# Patient Record
Sex: Female | Born: 1980
Health system: Southern US, Community
[De-identification: ages and names within clinical notes are randomized; demographics above are authoritative.]

## PROBLEM LIST (undated history)

## (undated) DIAGNOSIS — Z789 Other specified health status: Secondary | ICD-10-CM

## (undated) HISTORY — PX: NO PAST SURGERIES: SHX2092

---

## 2004-06-09 ENCOUNTER — Observation Stay: Payer: Self-pay | Admitting: Obstetrics and Gynecology

## 2004-06-24 ENCOUNTER — Inpatient Hospital Stay: Payer: Self-pay

## 2014-05-22 ENCOUNTER — Ambulatory Visit: Payer: Self-pay

## 2014-05-22 LAB — URINALYSIS, COMPLETE
Bacteria: NEGATIVE
Bilirubin,UR: NEGATIVE
Glucose,UR: NEGATIVE
Ketone: NEGATIVE
Leukocyte Esterase: NEGATIVE
NITRITE: NEGATIVE
PROTEIN: NEGATIVE
Ph: 6 (ref 5.0–8.0)
Specific Gravity: 1.02 (ref 1.000–1.030)

## 2014-05-22 LAB — WET PREP, GENITAL

## 2014-05-23 LAB — GC/CHLAMYDIA PROBE AMP

## 2015-03-23 ENCOUNTER — Other Ambulatory Visit: Payer: Self-pay | Admitting: Obstetrics and Gynecology

## 2015-03-23 DIAGNOSIS — Z1231 Encounter for screening mammogram for malignant neoplasm of breast: Secondary | ICD-10-CM

## 2015-03-25 ENCOUNTER — Ambulatory Visit: Payer: Self-pay

## 2015-03-31 ENCOUNTER — Ambulatory Visit
Admission: RE | Admit: 2015-03-31 | Discharge: 2015-03-31 | Disposition: A | Discharge status: Home / Self Care | Payer: BLUE CROSS/BLUE SHIELD | Source: Ambulatory Visit | Attending: Obstetrics and Gynecology | Admitting: Obstetrics and Gynecology

## 2015-03-31 DIAGNOSIS — Z1231 Encounter for screening mammogram for malignant neoplasm of breast: Secondary | ICD-10-CM | POA: Insufficient documentation

## 2017-08-17 DIAGNOSIS — D251 Intramural leiomyoma of uterus: Secondary | ICD-10-CM | POA: Diagnosis not present

## 2017-08-17 DIAGNOSIS — N921 Excessive and frequent menstruation with irregular cycle: Secondary | ICD-10-CM | POA: Diagnosis not present

## 2017-08-17 DIAGNOSIS — Z124 Encounter for screening for malignant neoplasm of cervix: Secondary | ICD-10-CM | POA: Diagnosis not present

## 2017-09-13 NOTE — H&P (Signed)
[name] is a 37 y.o. female here for Menstrual Problem .pt with > 3 yr h/o heavy menses . I saw her in 2016 c/o menorrhagia . Pt with known fibroid that has grown from 3 cm to a 6 cm . She soils her clothes and CBc showed anemia Some intermittent  Dyspareunia  No dysmenorrhea She tried Lysteda in past  EMBX and PAP wnl  G3P1 s/ p SVD  Past Medical History:  has a past medical history of Anemia, unspecified and History of uterine fibroid.  Past Surgical History:  has no past surgical history on file. Family History: family history includes Heart disease in her mother. Social History:  reports that she has never smoked. She has never used smokeless tobacco. She reports that she does not drink alcohol or use drugs. OB/GYN History:          OB History    Gravida  3   Para  1   Term  1   Preterm      AB  2   Living  1     SAB  2   TAB      Ectopic      Molar      Multiple      Live Births  1          Allergies: has No Known Allergies. Medications:  Current Outpatient Medications:  .  ferrous sulfate 325 (65 FE) MG tablet, Take 325 mg by mouth daily with breakfast., Disp: , Rfl:   Review of Systems: General:                      No fatigue or weight loss Eyes:                           No vision changes Ears:                            No hearing difficulty Respiratory:                No cough or shortness of breath Pulmonary:                  No asthma or shortness of breath Cardiovascular:           No chest pain, palpitations, dyspnea on exertion Gastrointestinal:          No abdominal bloating, chronic diarrhea, constipations, masses, pain or hematochezia Genitourinary:             No hematuria, dysuria, abnormal vaginal discharge, pelvic pain, ++Menometrorrhagia Lymphatic:                   No swollen lymph nodes Musculoskeletal:         No muscle weakness Neurologic:                  No extremity weakness, syncope, seizure  disorder Psychiatric:                  No history of depression, delusions or suicidal/homicidal ideation    Exam:      Vitals:   08/17/17 1336  BP: 106/72  Pulse: 56    Body mass index is 26.26 kg/m.  WDWN  female in NAD   Lungs: CTA  CV : RRR without murmur   Neck:  no thyromegaly Abdomen: soft ,  no mass, normal active bowel sounds,  non-tender, no rebound tenderness Pelvic: tanner stage 5 ,  External genitalia: vulva /labia no lesions Urethra: no prolapse Vagina: normal physiologic d/c- adequate room for a TVH Cx : no lesions Uterus: 12 weeks , + irregular shape , non-tender Adnexa: no mass,  non-tender   Rectovaginal:   Impression:   The primary encounter diagnosis was Menorrhagia with irregular cycle. Diagnoses of Routine cervical smear, Excessive or frequent menstruation, and Intramural leiomyoma of uterus were also pertinent to this visit.    Plan:  Via translator I talked to her about options:  I have spoken with the patient regarding treatment options including expectant management, hormonal options, or surgical intervention. After a full discussion the pt elects to proceed with [facility] and bilateral salpingectomy  Risks discussed . All questions answered

## 2017-09-14 ENCOUNTER — Other Ambulatory Visit: Payer: Self-pay

## 2017-09-14 ENCOUNTER — Encounter
Admission: RE | Admit: 2017-09-14 | Discharge: 2017-09-14 | Disposition: A | Discharge status: Home / Self Care | Payer: BLUE CROSS/BLUE SHIELD | Source: Ambulatory Visit | Attending: Obstetrics and Gynecology | Admitting: Obstetrics and Gynecology

## 2017-09-14 DIAGNOSIS — Z01812 Encounter for preprocedural laboratory examination: Secondary | ICD-10-CM | POA: Diagnosis not present

## 2017-09-14 DIAGNOSIS — Z0183 Encounter for blood typing: Secondary | ICD-10-CM | POA: Insufficient documentation

## 2017-09-14 HISTORY — DX: Other specified health status: Z78.9

## 2017-09-14 LAB — BASIC METABOLIC PANEL
Anion gap: 7 (ref 5–15)
BUN: 10 mg/dL (ref 6–20)
CHLORIDE: 105 mmol/L (ref 101–111)
CO2: 26 mmol/L (ref 22–32)
Calcium: 9 mg/dL (ref 8.9–10.3)
Creatinine, Ser: 0.56 mg/dL (ref 0.44–1.00)
GFR calc Af Amer: >60 mL/min (ref >60)
GFR calc non Af Amer: >60 mL/min (ref >60)
Glucose, Bld: 85 mg/dL (ref 65–99)
POTASSIUM: 3.8 mmol/L (ref 3.5–5.1)
SODIUM: 138 mmol/L (ref 135–145)

## 2017-09-14 LAB — TYPE AND SCREEN
ABO/RH(D): A NEG
Antibody Screen: NEGATIVE

## 2017-09-14 MED ORDER — FLEET ENEMA 7-19 GM/118ML RE ENEM
1.0000 | ENEMA | Freq: Once | RECTAL | Status: DC
  Filled 2017-09-14: qty 1

## 2017-09-14 NOTE — Patient Instructions (Addendum)
Your procedure is scheduled on:  Su procedimiento est programado para: lunes 13 de mayo 2019 Report to [place]nt[place] a: [place] To find out your arrival time please call [phone] between 1PM - 3PM on . Para saber su hora de llegada por favor llame al [phone] entre la 1PM - [place] viernes 10 de [place] 2019  Remember: Instructions that are not followed completely may result in serious medical risk, up to and including death, or upon the discretion of your surgeon and anesthesiologist your surgery may need to be rescheduled.  Recuerde: Las instrucciones que no se siguen completamente Heritage manager en un riesgo de salud grave, incluyendo hasta la [place] o a discrecin de su cirujano y Environmental health practitioner, su ciruga se puede posponer.   __X_ 1.Do not eat food after midnight the night before your procedure. No   gum chewing or hard candies. You may drink clear liquids up to 2 hours   before you are scheduled to arrive for your surgery- DO not drink clear   liquids within 2 hours of the start of your surgery.     Clear Liquids include:    water, apple juice without pulp, clear carbohydrate drink such as    Clearfast of [place], Black Coffee or Tea (Do not add anything to   coffee or tea).      No coma nada despus de la medianoche de la noche anterior a su   procedimiento. No coma chicles ni caramelos duros. Puede tomar   lquidos claros hasta 2 horas antes de su hora programada de llegada al   hospital para su procedimiento. No tome lquidos claros durante el   transcurso de las 2 horas de su llegada programada al hospital para su   procedimiento, ya que esto puede llevar a que su procedimiento se   retrase o tenga que volver a Health and safety inspector.  Los lquidos claros incluyen:         - Agua o jugo de [place] sin pulpa         - Bebidas claras con carbohidratos como ClearFast o Gatorade         - Caf negro o t claro (sin leche, sin cremas, no agregue nada al caf ni al  t)  No  tome nada que no est en esta lista.  Los pacientes con diabetes tipo 1 y tipo 2 solo deben Agricultural engineer.  Llame a la clnica de PreCare o a la unidad de Same Day Surgery si  tiene alguna pregunta sobre estas instrucciones.              _X__ 2.Do Not Smoke or use e-cigarettes For 24 Hours Prior to Your    Surgery.  Do not use any chewable tobacco products for at least 6   hours prior to surgery.    No fume ni use cigarrillos electrnicos durante las 24 horas previas   a su [place].  No use ningn producto de tabaco masticable durante  al menos 6 horas antes de la [place].     __X_ 3. No alcohol for 24 hours before or after surgery.    No tome alcohol durante las 24 horas antes ni despus de la [place].   ____4. Bring all medications with you on the day of surgery if instructed.    Lleve todos los medicamentos con usted el da de su ciruga si se le   ha indicado as.   ____ 5. Notify your doctor if there is any  change in your medical condition (cold,   fever, infections).    Informe a su mdico si hay algn cambio en su condicin mdica   (resfriado, fiebre, infecciones).   Do not wear jewelry, make-up, hairpins, clips or nail polish.  No use joyas, maquillajes, pinzas/ganchos para el cabello ni esmalte de uas.  Do not wear lotions, powders, or perfumes. You may wear deodorant.  No use lociones, polvos o perfumes.  Puede usar desodorante.    Do not shave 48 hours prior to surgery. Men may shave face and neck.  No se afeite 48 horas antes de la [place].  Los hombres pueden [employer] cara  y el cuello.   Do not bring valuables to the hospital.   No lleve objetos [place] is not responsible for any belongings or valuables.  [place] no se hace responsable de ningn tipo de pertenencias u objetos de Geographical information systems officer.               Contacts, dentures or bridgework may not be worn into surgery.  Los lentes de [place], las dentaduras postizas o puentes no se pueden usar  en la [place].  Leave your suitcase in the car. After surgery it may be brought to your room.  Deje su maleta en el auto.  Despus de la ciruga podr traerla a su habitacin.  For patients admitted to the hospital, discharge time is determined by your treatment team.  Para los pacientes que sean ingresados al hospital, el tiempo en el cual se le dar de alta es determinado por su equipo de [place].   Patients discharged the day of surgery will not be allowed to drive home. A los pacientes que se les da de alta el mismo da de la ciruga no se les permitir conducir a Holiday representative.   Please read over the following fact sheets that you were given: Por favor [place] informacin que le dieron:      _x___ Take these medicines the morning of surgery with A SIP OF WATER:          [employer] medicinas la maana de la ciruga con UN SORBO DE AGUA:  1. Nada    _x___ [place] Enema (as directed)          Enema de [place] (segn lo indicado)

## 2017-09-19 DIAGNOSIS — Z1231 Encounter for screening mammogram for malignant neoplasm of breast: Secondary | ICD-10-CM | POA: Diagnosis not present

## 2017-09-25 ENCOUNTER — Encounter: Payer: Self-pay | Admitting: *Deleted

## 2017-09-25 ENCOUNTER — Other Ambulatory Visit: Payer: Self-pay

## 2017-09-25 ENCOUNTER — Ambulatory Visit: Payer: BLUE CROSS/BLUE SHIELD | Admitting: Anesthesiology

## 2017-09-25 ENCOUNTER — Observation Stay
Admission: RE | Admit: 2017-09-25 | Discharge: 2017-09-26 | Disposition: A | Discharge status: Home / Self Care | Payer: BLUE CROSS/BLUE SHIELD | Source: Ambulatory Visit | Attending: Obstetrics and Gynecology | Admitting: Obstetrics and Gynecology

## 2017-09-25 ENCOUNTER — Encounter
Admission: RE | Disposition: A | Discharge status: Home / Self Care | Payer: Self-pay | Source: Ambulatory Visit | Attending: Obstetrics and Gynecology

## 2017-09-25 DIAGNOSIS — Z9889 Other specified postprocedural states: Secondary | ICD-10-CM

## 2017-09-25 DIAGNOSIS — N921 Excessive and frequent menstruation with irregular cycle: Secondary | ICD-10-CM | POA: Diagnosis not present

## 2017-09-25 DIAGNOSIS — N72 Inflammatory disease of cervix uteri: Secondary | ICD-10-CM | POA: Diagnosis not present

## 2017-09-25 DIAGNOSIS — N941 Unspecified dyspareunia: Secondary | ICD-10-CM | POA: Insufficient documentation

## 2017-09-25 DIAGNOSIS — N920 Excessive and frequent menstruation with regular cycle: Secondary | ICD-10-CM | POA: Diagnosis not present

## 2017-09-25 DIAGNOSIS — N838 Other noninflammatory disorders of ovary, fallopian tube and broad ligament: Secondary | ICD-10-CM | POA: Diagnosis not present

## 2017-09-25 DIAGNOSIS — Z79899 Other long term (current) drug therapy: Secondary | ICD-10-CM | POA: Insufficient documentation

## 2017-09-25 DIAGNOSIS — D259 Leiomyoma of uterus, unspecified: Secondary | ICD-10-CM | POA: Insufficient documentation

## 2017-09-25 HISTORY — PX: BILATERAL SALPINGECTOMY: SHX5743

## 2017-09-25 HISTORY — PX: VAGINAL HYSTERECTOMY: SHX2639

## 2017-09-25 LAB — POCT I-STAT 4, (NA,K, GLUC, HGB,HCT)
Glucose, Bld: 88 mg/dL (ref 65–99)
HCT: 30 % — ABNORMAL LOW (ref 36.0–46.0)
Hemoglobin: 10.2 g/dL — ABNORMAL LOW (ref 12.0–15.0)
POTASSIUM: 3.9 mmol/L (ref 3.5–5.1)
Sodium: 139 mmol/L (ref 135–145)

## 2017-09-25 LAB — POCT PREGNANCY, URINE: Preg Test, Ur: NEGATIVE

## 2017-09-25 LAB — ABO/RH: ABO/RH(D): A NEG

## 2017-09-25 SURGERY — HYSTERECTOMY, VAGINAL
Anesthesia: General

## 2017-09-25 MED ORDER — ONDANSETRON HCL 4 MG PO TABS: 4.0000 mg | ORAL_TABLET | Freq: Four times a day (QID) | ORAL | Status: DC | PRN

## 2017-09-25 MED ORDER — CEFAZOLIN SODIUM-DEXTROSE 2-4 GM/100ML-% IV SOLN
INTRAVENOUS | Status: AC
  Filled 2017-09-25: qty 100

## 2017-09-25 MED ORDER — FAMOTIDINE 20 MG PO TABS
ORAL_TABLET | ORAL | Status: AC
  Administered 2017-09-25: 20 mg via ORAL
  Filled 2017-09-25: qty 1

## 2017-09-25 MED ORDER — FUROSEMIDE 10 MG/ML IJ SOLN
INTRAMUSCULAR | Status: AC
  Filled 2017-09-25: qty 4

## 2017-09-25 MED ORDER — LACTATED RINGERS IV SOLN
INTRAVENOUS | Status: DC
  Administered 2017-09-25: 21:00:00 via INTRAVENOUS

## 2017-09-25 MED ORDER — PROMETHAZINE HCL 25 MG/ML IJ SOLN
25.0000 mg | INTRAMUSCULAR | Status: DC | PRN
  Administered 2017-09-25: 25 mg via INTRAVENOUS
  Filled 2017-09-25 (×2): qty 1

## 2017-09-25 MED ORDER — ONDANSETRON HCL 4 MG/2ML IJ SOLN
4.0000 mg | Freq: Once | INTRAMUSCULAR | Status: AC | PRN
  Administered 2017-09-25: 4 mg via INTRAVENOUS

## 2017-09-25 MED ORDER — SEVOFLURANE IN SOLN
RESPIRATORY_TRACT | Status: AC
  Filled 2017-09-25: qty 250

## 2017-09-25 MED ORDER — ROCURONIUM BROMIDE 100 MG/10ML IV SOLN
INTRAVENOUS | Status: DC | PRN
  Administered 2017-09-25: 50 mg via INTRAVENOUS

## 2017-09-25 MED ORDER — LIDOCAINE-EPINEPHRINE 1 %-1:100000 IJ SOLN
INTRAMUSCULAR | Status: DC | PRN
  Administered 2017-09-25: 7 mL

## 2017-09-25 MED ORDER — KETOROLAC TROMETHAMINE 30 MG/ML IJ SOLN
INTRAMUSCULAR | Status: DC | PRN
  Administered 2017-09-25: 30 mg via INTRAVENOUS

## 2017-09-25 MED ORDER — ROCURONIUM BROMIDE 50 MG/5ML IV SOLN
INTRAVENOUS | Status: AC
  Filled 2017-09-25: qty 1

## 2017-09-25 MED ORDER — LIDOCAINE HCL (CARDIAC) PF 100 MG/5ML IV SOSY
PREFILLED_SYRINGE | INTRAVENOUS | Status: DC | PRN
  Administered 2017-09-25: 100 mg via INTRAVENOUS

## 2017-09-25 MED ORDER — DEXAMETHASONE SODIUM PHOSPHATE 10 MG/ML IJ SOLN
INTRAMUSCULAR | Status: DC | PRN
  Administered 2017-09-25: 6 mg via INTRAVENOUS

## 2017-09-25 MED ORDER — ONDANSETRON HCL 4 MG/2ML IJ SOLN
INTRAMUSCULAR | Status: AC
Start: 2017-09-25 — End: 2017-09-26
  Filled 2017-09-25: qty 2

## 2017-09-25 MED ORDER — PROPOFOL 10 MG/ML IV BOLUS
INTRAVENOUS | Status: AC
  Filled 2017-09-25: qty 20

## 2017-09-25 MED ORDER — FENTANYL CITRATE (PF) 100 MCG/2ML IJ SOLN
INTRAMUSCULAR | Status: AC
  Administered 2017-09-25: 25 ug via INTRAVENOUS
  Filled 2017-09-25: qty 2

## 2017-09-25 MED ORDER — FENTANYL CITRATE (PF) 250 MCG/5ML IJ SOLN
INTRAMUSCULAR | Status: AC
  Filled 2017-09-25: qty 5

## 2017-09-25 MED ORDER — FENTANYL CITRATE (PF) 100 MCG/2ML IJ SOLN
25.0000 ug | INTRAMUSCULAR | Status: DC | PRN
Start: 2017-09-25 — End: 2017-09-25
  Administered 2017-09-25 (×4): 25 ug via INTRAVENOUS

## 2017-09-25 MED ORDER — SODIUM CHLORIDE 0.9 % IJ SOLN
INTRAMUSCULAR | Status: AC
Start: 2017-09-25 — End: 2017-09-26
  Filled 2017-09-25: qty 10

## 2017-09-25 MED ORDER — MIDAZOLAM HCL 2 MG/2ML IJ SOLN
INTRAMUSCULAR | Status: DC | PRN
  Administered 2017-09-25: 2 mg via INTRAVENOUS

## 2017-09-25 MED ORDER — OXYCODONE-ACETAMINOPHEN 5-325 MG PO TABS
1.0000 | ORAL_TABLET | ORAL | Status: DC | PRN
  Administered 2017-09-26: 1 via ORAL
  Filled 2017-09-25: qty 1

## 2017-09-25 MED ORDER — PROPOFOL 10 MG/ML IV BOLUS
INTRAVENOUS | Status: DC | PRN
  Administered 2017-09-25: 120 mg via INTRAVENOUS

## 2017-09-25 MED ORDER — SIMETHICONE 80 MG PO CHEW: 80.0000 mg | CHEWABLE_TABLET | Freq: Four times a day (QID) | ORAL | Status: DC | PRN

## 2017-09-25 MED ORDER — CEFAZOLIN SODIUM-DEXTROSE 2-4 GM/100ML-% IV SOLN
2.0000 g | Freq: Once | INTRAVENOUS | Status: AC
  Administered 2017-09-25: 2 g via INTRAVENOUS

## 2017-09-25 MED ORDER — LIDOCAINE-EPINEPHRINE (PF) 1 %-1:200000 IJ SOLN
INTRAMUSCULAR | Status: AC
  Filled 2017-09-25: qty 30

## 2017-09-25 MED ORDER — LIDOCAINE-EPINEPHRINE 1 %-1:100000 IJ SOLN
INTRAMUSCULAR | Status: AC
  Filled 2017-09-25: qty 1

## 2017-09-25 MED ORDER — ONDANSETRON HCL 4 MG/2ML IJ SOLN
INTRAMUSCULAR | Status: DC | PRN
  Administered 2017-09-25: 4 mg via INTRAVENOUS

## 2017-09-25 MED ORDER — IBUPROFEN 800 MG PO TABS: 800.0000 mg | ORAL_TABLET | Freq: Three times a day (TID) | ORAL | Status: DC | PRN

## 2017-09-25 MED ORDER — FAMOTIDINE 20 MG PO TABS
20.0000 mg | ORAL_TABLET | Freq: Once | ORAL | Status: AC
  Administered 2017-09-25: 20 mg via ORAL

## 2017-09-25 MED ORDER — ACETAMINOPHEN 10 MG/ML IV SOLN
INTRAVENOUS | Status: AC
  Filled 2017-09-25: qty 100

## 2017-09-25 MED ORDER — MORPHINE SULFATE (PF) 2 MG/ML IV SOLN
1.0000 mg | INTRAVENOUS | Status: DC | PRN
  Administered 2017-09-25 (×2): 2 mg via INTRAVENOUS
  Filled 2017-09-25 (×2): qty 1

## 2017-09-25 MED ORDER — FENTANYL CITRATE (PF) 100 MCG/2ML IJ SOLN
INTRAMUSCULAR | Status: DC | PRN
  Administered 2017-09-25 (×2): 50 ug via INTRAVENOUS
  Administered 2017-09-25: 100 ug via INTRAVENOUS

## 2017-09-25 MED ORDER — ONDANSETRON HCL 4 MG/2ML IJ SOLN: 4.0000 mg | Freq: Four times a day (QID) | INTRAMUSCULAR | Status: DC | PRN

## 2017-09-25 MED ORDER — MIDAZOLAM HCL 2 MG/2ML IJ SOLN
INTRAMUSCULAR | Status: AC
  Filled 2017-09-25: qty 2

## 2017-09-25 MED ORDER — LACTATED RINGERS IV SOLN
INTRAVENOUS | Status: DC
  Administered 2017-09-25: 11:00:00 via INTRAVENOUS

## 2017-09-25 SURGICAL SUPPLY — 33 items
BAG URINE DRAINAGE (UROLOGICAL SUPPLIES) ×4 IMPLANT
CANISTER SUCT 1200ML W/VALVE (MISCELLANEOUS) ×4 IMPLANT
CATH FOLEY 2WAY  5CC 16FR (CATHETERS) ×2
CATH ROBINSON RED A/P 16FR (CATHETERS) ×4 IMPLANT
CATH URTH 16FR FL 2W BLN LF (CATHETERS) ×2 IMPLANT
DRAPE PERI LITHO V/GYN (MISCELLANEOUS) ×4 IMPLANT
DRAPE SURG 17X11 SM STRL (DRAPES) ×4 IMPLANT
DRAPE UNDER BUTTOCK W/FLU (DRAPES) ×4 IMPLANT
ELECT REM PT RETURN 9FT ADLT (ELECTROSURGICAL) ×4
ELECTRODE REM PT RTRN 9FT ADLT (ELECTROSURGICAL) ×2 IMPLANT
GLOVE BIO SURGEON STRL SZ8 (GLOVE) ×4 IMPLANT
GOWN STRL REUS W/ TWL LRG LVL3 (GOWN DISPOSABLE) ×6 IMPLANT
GOWN STRL REUS W/ TWL XL LVL3 (GOWN DISPOSABLE) ×2 IMPLANT
GOWN STRL REUS W/TWL LRG LVL3 (GOWN DISPOSABLE) ×6
GOWN STRL REUS W/TWL XL LVL3 (GOWN DISPOSABLE) ×2
KIT TURNOVER CYSTO (KITS) ×4 IMPLANT
LABEL OR SOLS (LABEL) ×4 IMPLANT
NEEDLE HYPO 22GX1.5 SAFETY (NEEDLE) ×4 IMPLANT
PACK BASIN MINOR ARMC (MISCELLANEOUS) ×4 IMPLANT
PAD OB MATERNITY 4.3X12.25 (PERSONAL CARE ITEMS) ×4 IMPLANT
PAD PREP 24X41 OB/GYN DISP (PERSONAL CARE ITEMS) ×4 IMPLANT
SOL PREP PVP 2OZ (MISCELLANEOUS) ×4
SOLUTION PREP PVP 2OZ (MISCELLANEOUS) ×2 IMPLANT
SURGILUBE 2OZ TUBE FLIPTOP (MISCELLANEOUS) ×4 IMPLANT
SUT PDS 2-0 27IN (SUTURE) ×4 IMPLANT
SUT VIC AB 0 CT1 27 (SUTURE) ×6
SUT VIC AB 0 CT1 27XCR 8 STRN (SUTURE) ×6 IMPLANT
SUT VIC AB 0 CT1 36 (SUTURE) ×12 IMPLANT
SUT VIC AB 2-0 SH 27 (SUTURE) ×6
SUT VIC AB 2-0 SH 27XBRD (SUTURE) ×6 IMPLANT
SYR 10ML LL (SYRINGE) ×4 IMPLANT
SYR CONTROL 10ML (SYRINGE) ×4 IMPLANT
WATER STERILE IRR 1000ML POUR (IV SOLUTION) ×4 IMPLANT

## 2017-09-25 NOTE — Anesthesia Procedure Notes (Signed)
Procedure Name: Intubation Date/Time: 09/25/2017 11:51 AM Performed by: Philbert Riser, CRNA Pre-anesthesia Checklist: Emergency Drugs available, Patient identified, Suction available, Patient being monitored and Timeout performed Patient Re-evaluated:Patient Re-evaluated prior to induction Oxygen Delivery Method: Circle system utilized and Simple face mask Preoxygenation: Pre-oxygenation with 100% oxygen Induction Type: IV induction Ventilation: Mask ventilation without difficulty Laryngoscope Size: Mac and 3 Grade View: Grade I Tube type: Oral Number of attempts: 1 Airway Equipment and Method: Stylet Placement Confirmation: ETT inserted through vocal cords under direct vision,  positive ETCO2 and breath sounds checked- equal and bilateral Secured at: 22 cm Tube secured with: Tape

## 2017-09-25 NOTE — Brief Op Note (Signed)
09/25/2017  1:49 PM  PATIENT:  Julie Oconnor  37 y.o. female  PRE-OPERATIVE DIAGNOSIS:  Menorrhagia, Fibroid  POST-OPERATIVE DIAGNOSIS:  Menorrhagia, Fibroid  PROCEDURE:  Procedure(s): HYSTERECTOMY VAGINAL (N/A) BILATERAL SALPINGECTOMY (Bilateral)  SURGEON:  Surgeon(s) and Role:    * Cheral Cappucci, Gwen Her, MD - Primary    * Benjaman Kindler, MD - Assisting  PHYSICIAN ASSISTANT: PA student [place]  ASSISTANTS: none   ANESTHESIA:   general  EBL:  250 , IOF 1000  UO 300 cc BLOOD ADMINISTERED:none  DRAINS: Urinary Catheter (Foley)   LOCAL MEDICATIONS USED:  LIDOCAINE   SPECIMEN:  Source of Specimen:  cervix , uterus and bilateral fallopian tube s  DISPOSITION OF SPECIMEN:  PATHOLOGY  COUNTS:  YES  TOURNIQUET:  * No tourniquets in log *  DICTATION: .Other Dictation: Dictation Number verbal  PLAN OF CARE: Admit for overnight observation  PATIENT DISPOSITION:  PACU - hemodynamically stable.   Delay start of Pharmacological VTE agent (>24hrs) due to surgical blood loss or risk of bleeding: not applicable

## 2017-09-25 NOTE — Anesthesia Preprocedure Evaluation (Signed)
Anesthesia Evaluation  Patient identified by MRN, date of birth, ID band Patient awake    Reviewed: Allergy & Precautions, NPO status , Patient's Chart, lab work & pertinent test results  History of Anesthesia Complications Negative for: history of anesthetic complications  Airway Mallampati: II       Dental   Pulmonary neg sleep apnea, neg COPD,           Cardiovascular (-) hypertension(-) Past MI and (-) CHF (-) dysrhythmias (-) Valvular Problems/Murmurs     Neuro/Psych neg Seizures    GI/Hepatic Neg liver ROS, neg GERD  ,  Endo/Other  neg diabetes  Renal/GU negative Renal ROS     Musculoskeletal   Abdominal   Peds  Hematology   Anesthesia Other Findings   Reproductive/Obstetrics                             Anesthesia Physical Anesthesia Plan  ASA: I  Anesthesia Plan: General   Post-op Pain Management:    Induction:   PONV Risk Score and Plan: 3 and Midazolam, Dexamethasone and Ondansetron  Airway Management Planned: Oral ETT  Additional Equipment:   Intra-op Plan:   Post-operative Plan:   Informed Consent: I have reviewed the patients History and Physical, chart, labs and discussed the procedure including the risks, benefits and alternatives for the proposed anesthesia with the patient or authorized representative who has indicated his/her understanding and acceptance.     Plan Discussed with:   Anesthesia Plan Comments:         Anesthesia Quick Evaluation

## 2017-09-25 NOTE — Progress Notes (Signed)
CNA to pts room, CNA relayed message to RN that pt had been sick, RN to room. Pt states was sick earlier denies nausea at this time. Pt states has some pain but does not want medication at this time. Family at bedside, pt returns to resting easily.

## 2017-09-25 NOTE — Anesthesia Postprocedure Evaluation (Signed)
Anesthesia Post Note  Patient: Julie Oconnor  Procedure(s) Performed: HYSTERECTOMY VAGINAL (N/A ) BILATERAL SALPINGECTOMY (Bilateral )  Patient location during evaluation: PACU Anesthesia Type: General Level of consciousness: awake and alert Pain management: pain level controlled Vital Signs Assessment: post-procedure vital signs reviewed and stable Respiratory status: spontaneous breathing and respiratory function stable Cardiovascular status: stable Anesthetic complications: no     Last Vitals:  Vitals:   09/25/17 0958 09/25/17 1410  BP: 122/73 116/71  Pulse: 71 67  Resp: 18 18  Temp: 36.8 C 36.7 C  SpO2: 100% 100%    Last Pain:  Vitals:   09/25/17 1410  PainSc: Asleep                 Alroy Portela K

## 2017-09-25 NOTE — Anesthesia Post-op Follow-up Note (Signed)
Anesthesia QCDR form completed.        

## 2017-09-25 NOTE — Progress Notes (Signed)
Pt here and is ready for [facility] and bilateral salpingectomy . The procedure was reviewed again with the translator. Hct 30% , neg HCG  All questions answered   Proceed

## 2017-09-25 NOTE — Progress Notes (Signed)
Patient ID: Julie Oconnor, female   DOB: 25-Dec-1980, 37 y.o.   MRN: [id number] DOS  Some nausea . VSS good urine output  Phenergan and po pain meds . Antinausea D/c in am

## 2017-09-25 NOTE — Transfer of Care (Signed)
Immediate Anesthesia Transfer of Care Note  Patient: Julie Oconnor  Procedure(s) Performed: HYSTERECTOMY VAGINAL (N/A ) BILATERAL SALPINGECTOMY (Bilateral )  Patient Location: PACU  Anesthesia Type:General  Level of Consciousness: awake and oriented  Airway & Oxygen Therapy: Patient Spontanous Breathing and Patient connected to face mask oxygen  Post-op Assessment: Report given to RN and Post -op Vital signs reviewed and stable  Post vital signs: Reviewed and stable  Last Vitals:  Vitals Value Taken Time  BP 116/71 09/25/2017  2:10 PM  Temp 36.7 C 09/25/2017  2:10 PM  Pulse 60 09/25/2017  2:12 PM  Resp 15 09/25/2017  2:12 PM  SpO2 100 % 09/25/2017  2:12 PM  Vitals shown include unvalidated device data.  Last Pain:  Vitals:   09/25/17 1410  PainSc: (P) Asleep         Complications: No apparent anesthesia complications

## 2017-09-26 ENCOUNTER — Encounter: Payer: Self-pay | Admitting: Obstetrics and Gynecology

## 2017-09-26 DIAGNOSIS — N838 Other noninflammatory disorders of ovary, fallopian tube and broad ligament: Secondary | ICD-10-CM | POA: Diagnosis not present

## 2017-09-26 DIAGNOSIS — Z79899 Other long term (current) drug therapy: Secondary | ICD-10-CM | POA: Diagnosis not present

## 2017-09-26 DIAGNOSIS — N921 Excessive and frequent menstruation with irregular cycle: Secondary | ICD-10-CM | POA: Diagnosis not present

## 2017-09-26 DIAGNOSIS — N941 Unspecified dyspareunia: Secondary | ICD-10-CM | POA: Diagnosis not present

## 2017-09-26 DIAGNOSIS — N920 Excessive and frequent menstruation with regular cycle: Secondary | ICD-10-CM | POA: Diagnosis not present

## 2017-09-26 DIAGNOSIS — D259 Leiomyoma of uterus, unspecified: Secondary | ICD-10-CM | POA: Diagnosis not present

## 2017-09-26 DIAGNOSIS — N72 Inflammatory disease of cervix uteri: Secondary | ICD-10-CM | POA: Diagnosis not present

## 2017-09-26 LAB — BASIC METABOLIC PANEL
Anion gap: 5 (ref 5–15)
BUN: 8 mg/dL (ref 6–20)
CHLORIDE: 103 mmol/L (ref 101–111)
CO2: 27 mmol/L (ref 22–32)
CREATININE: 0.68 mg/dL (ref 0.44–1.00)
Calcium: 8.5 mg/dL — ABNORMAL LOW (ref 8.9–10.3)
GFR calc Af Amer: >60 mL/min (ref >60)
GFR calc non Af Amer: >60 mL/min (ref >60)
Glucose, Bld: 93 mg/dL (ref 65–99)
Potassium: 3.6 mmol/L (ref 3.5–5.1)
SODIUM: 135 mmol/L (ref 135–145)

## 2017-09-26 LAB — CBC
HCT: 25.8 % — ABNORMAL LOW (ref 35.0–47.0)
HEMOGLOBIN: 7.9 g/dL — AB (ref 12.0–16.0)
MCH: 20.3 pg — AB (ref 26.0–34.0)
MCHC: 30.6 g/dL — AB (ref 32.0–36.0)
MCV: 66.2 fL — ABNORMAL LOW (ref 80.0–100.0)
Platelets: 267 10*3/uL (ref 150–440)
RBC: 3.9 MIL/uL (ref 3.80–5.20)
RDW: 17.4 % — ABNORMAL HIGH (ref 11.5–14.5)
WBC: 8.8 10*3/uL (ref 3.6–11.0)

## 2017-09-26 MED ORDER — OXYCODONE-ACETAMINOPHEN 5-325 MG PO TABS: 1.0000 | ORAL_TABLET | ORAL | 0 refills | Status: DC | PRN

## 2017-09-26 MED ORDER — PROMETHAZINE HCL 12.5 MG PO TABS: 12.5000 mg | ORAL_TABLET | Freq: Four times a day (QID) | ORAL | 0 refills | Status: DC | PRN

## 2017-09-26 MED ORDER — IBUPROFEN 800 MG PO TABS: 800.0000 mg | ORAL_TABLET | Freq: Three times a day (TID) | ORAL | 0 refills | Status: DC | PRN

## 2017-09-26 MED ORDER — SIMETHICONE 80 MG PO CHEW: 80.0000 mg | CHEWABLE_TABLET | Freq: Four times a day (QID) | ORAL | 0 refills | Status: DC | PRN

## 2017-09-26 MED ORDER — DOCUSATE SODIUM 100 MG PO CAPS: 100.0000 mg | ORAL_CAPSULE | Freq: Every day | ORAL | 2 refills | Status: AC | PRN

## 2017-09-26 NOTE — Progress Notes (Signed)
Patient discharged home with husband. Discharge instructions and prescriptions given and reviewed with patient using spanish interpretor. Patient verbalized understanding. Escorted out by auxillary.

## 2017-09-26 NOTE — Discharge Planning (Signed)
  Sep 26, 2017  Patient: Julie Oconnor  Date of Birth: 08/21/1980  Date of Visit: 09/25/2017    To Whom It May Concern:  Jnaya Butrick was seen and treated in our Hospital on 09/25/17-09/26/17. Madine Sarr may be released to return to work on or after 10/12/17.  Sincerely,  Hiram Gash, RN

## 2017-09-26 NOTE — Op Note (Signed)
NAME: ROBBI, SPELLS MEDICAL RECORD QQ:[id number] ACCOUNT [id number] DATE OF BIRTH:1980/12/25 FACILITY: [facility] LOCATION: [facility] PHYSICIAN:Williemae Muriel Josefine Class, MD  OPERATIVE REPORT  DATE OF PROCEDURE:  09/25/2017  PREOPERATIVE DIAGNOSES: 1.  Symptomatic fibroid uterus. 2.  Menorrhagia.  POSTOPERATIVE DIAGNOSES: 1.  Symptomatic fibroid uterus. 2.  Menorrhagia.  PROCEDURE: 1.  Total vaginal hysterectomy. 2.  Bilateral salpingectomy.  ANESTHESIA:  General endotracheal anesthesia.  SURGEON:  Laverta Baltimore, MD  FIRST ASSISTANT:  Benjaman Kindler, MD  SECOND ASSISTANT:  PA student, [place]  INDICATION:  A 37 year old gravida 3, para 1 patient with a long history of heavy bleeding, greater than 3 years, failing conservative treatment.  The patient now further desires fertility.  FINDINGS:  Large anterior uterine fibroid approximately 6 cm in size.  DESCRIPTION OF PROCEDURE:  After adequate general endotracheal anesthesia, the patient was placed in dorsal supine position with legs placed in the [place].  The patient's abdomen, perineum and vagina were prepped and draped in normal sterile  fashion.  The patient did receive 2 g IV Ancef prior to commencement of the case.  Timeout was performed.  Straight catheterization of the bladder yielded 300 mL clear urine.  A weighted speculum was placed in the posterior vaginal vault, and the  anterior cervix and posterior cervix were grasped with thyroid tenacula.  A direct posterior colpotomy incision was made upon entry into the posterior cul-de-sac.  The uterosacral ligaments were bilaterally clamped, transected, suture ligated with 0  Vicryl suture.  Anterior cervix was incised with the [place].  The anterior cul-de-sac was entered sharply.  A Deaver retractor was placed within to elevate the bladder anteriorly.  The cardinal ligaments were bilaterally clamped, transected, suture  ligated with 0 Vicryl suture.  The  uterine arteries were then bilaterally clamped, transected, suture ligated with 0 Vicryl suture.  The uterus was decompressed given the size of the uterus and the notable anterior fibroid.  The uterus was cored out.   Continued sequential clamping and transection and suturing up to the lateral aspect of the uterus.  Additional decompressing of the uterus was performed.  Ultimately, the fibroid was identified and dissected from the myometrium.  The cornua were then  bilaterally clamped, transected, suture ligated.  The uterus was delivered.  Each fallopian tube was grasped with the fimbriated end and clamped at the midportion of the fallopian tube and transected, and each pedicle was closed with 0 Vicryl suture.   Good hemostasis was noted at the vaginal cuff.  The peritoneum was closed in a pursestring fashion with 2-0 PDS suture, and the vaginal cuff was then closed with a running 0 Vicryl suture.  Good hemostasis was noted.  There were no complications.  A  Foley catheter was placed at the end of the case yielding an additional 50 mL of urine.  ESTIMATED BLOOD LOSS:  250 mL.  URINE OUTPUT:  300 mL.  INTRAOPERATIVE FLUIDS:  1000 mL.  The patient did receive 1000 mg of Tylenol and 30 mg of intravenous Toradol.  The patient was taken to recovery room in good condition.  LN/NUANCE  D:09/25/2017 T:09/26/2017 JOB:[id number]

## 2017-09-26 NOTE — Discharge Summary (Signed)
Physician Discharge Summary  Patient ID: Julie Oconnor MRN: [id number] DOB/AGE: 39-Apr-1982 39 y.o.  Admit date: 09/25/2017 Discharge date: 09/26/2017  Admission Diagnoses:fibroids , menorrhagia  Discharge Diagnoses: same  Active Problems:   Postoperative state   Discharged Condition: good  Hospital Course: she underwent an uncomplicated TVH and bilateral salpigectomy . Post op did well .   Consults: None  Significant Diagnostic Studies: labs:  Results for orders placed or performed during the hospital encounter of 09/25/17 (from the past 24 hour(s))  Pregnancy, urine POC     Status: None   Collection Time: 09/25/17 10:02 AM  Result Value Ref Range   Preg Test, Ur NEGATIVE NEGATIVE  ABO/Rh     Status: None   Collection Time: 09/25/17 10:12 AM  Result Value Ref Range   ABO/RH(D)      A NEG Performed at [facility], [address] [place],  [address]   I-STAT 4, (NA,K, GLUC, HGB,HCT)     Status: Abnormal   Collection Time: 09/25/17 11:35 AM  Result Value Ref Range   Sodium 139 135 - 145 mmol/L   Potassium 3.9 3.5 - 5.1 mmol/L   Glucose, Bld 88 65 - 99 mg/dL   HCT 30.0 (L) 36.0 - 46.0 %   Hemoglobin 10.2 (L) 12.0 - 15.0 g/dL  CBC     Status: Abnormal   Collection Time: 09/26/17  5:32 AM  Result Value Ref Range   WBC 8.8 3.6 - 11.0 K/uL   RBC 3.90 3.80 - 5.20 MIL/uL   Hemoglobin 7.9 (L) 12.0 - 16.0 g/dL   HCT 25.8 (L) 35.0 - 47.0 %   MCV 66.2 (L) 80.0 - 100.0 fL   MCH 20.3 (L) 26.0 - 34.0 pg   MCHC 30.6 (L) 32.0 - 36.0 g/dL   RDW 17.4 (H) 11.5 - 14.5 %   Platelets 267 150 - 440 K/uL  Basic metabolic panel     Status: Abnormal   Collection Time: 09/26/17  5:32 AM  Result Value Ref Range   Sodium 135 135 - 145 mmol/L   Potassium 3.6 3.5 - 5.1 mmol/L   Chloride 103 101 - 111 mmol/L   CO2 27 22 - 32 mmol/L   Glucose, Bld 93 65 - 99 mg/dL   BUN 8 6 - 20 mg/dL   Creatinine, Ser 0.68 0.44 - 1.00 mg/dL   Calcium 8.5 (L) 8.9 - 10.3 mg/dL    GFR calc non Af Amer >60 >60 mL/min   GFR calc Af Amer >60 >60 mL/min   Anion gap 5 5 - 15    Treatments: surgery: as above  Discharge Exam: Blood pressure 122/76, pulse 65, temperature 98.5 F (36.9 C), temperature source Oral, resp. rate 18, height 5\' 4"  (1.626 m), weight 69.9 kg (154 lb), last menstrual period 09/12/2017, SpO2 98 %. General appearance: alert and cooperative Resp: clear to auscultation bilaterally Cardio: regular rate and rhythm, S1, S2 normal, no murmur, click, rub or gallop GI: soft, non-tender; bowel sounds normal; no masses,  no organomegaly Pelvic : no d/c  Disposition:  D/c home   Allergies as of 09/26/2017   No Known Allergies     Medication List    STOP taking these medications   acetaminophen 325 MG tablet Commonly known as:  TYLENOL     TAKE these medications   docusate sodium 100 MG capsule Commonly known as:  COLACE Take 1 capsule (100 mg total) by mouth daily as needed.   ibuprofen 800 MG  tablet Commonly known as:  ADVIL,MOTRIN Take 1 tablet (800 mg total) by mouth every 8 (eight) hours as needed (mild pain).   IRON-FOLIC ACID PO Take 1 tablet by mouth daily.   oxyCODONE-acetaminophen 5-325 MG tablet Commonly known as:  PERCOCET/ROXICET Take 1-2 tablets by mouth every 4 (four) hours as needed for moderate pain ((when tolerating fluids)).   promethazine 12.5 MG tablet Commonly known as:  PHENERGAN Take 1 tablet (12.5 mg total) by mouth every 6 (six) hours as needed for nausea or vomiting.   simethicone 80 MG chewable tablet Commonly known as:  MYLICON Chew 1 tablet (80 mg total) by mouth 4 (four) times daily as needed for flatulence.      Follow-up Information    Schermerhorn, Gwen Her, MD Follow up in 2 week(s).   Specialty:  Obstetrics and Gynecology Why:  post op  Contact information: [address] [place] [address] [place] [address] [phone]           Signed: Gwen Her  Schermerhorn 09/26/2017, 8:42 AM

## 2017-09-27 LAB — SURGICAL PATHOLOGY

## 2018-05-22 LAB — CBC AND DIFFERENTIAL
HCT: 37 (ref 36–46)
Hemoglobin: 12 (ref 12.0–16.0)
Platelets: 316 (ref 150–399)
WBC: 5.8

## 2018-05-22 LAB — VITAMIN D 25 HYDROXY (VIT D DEFICIENCY, FRACTURES): Vit D, 25-Hydroxy: 14.9

## 2018-05-22 LAB — HEPATIC FUNCTION PANEL
ALT: 17 (ref 7–35)
AST: 18 (ref 13–35)
Alkaline Phosphatase: 60 (ref 25–125)
Bilirubin, Total: 0.4

## 2018-05-22 LAB — BASIC METABOLIC PANEL
BUN: 11 (ref 4–21)
Creatinine: 0.7 (ref 0.5–1.1)
Glucose: 75
Potassium: 4.7 (ref 3.4–5.3)
Sodium: 140 (ref 137–147)

## 2018-05-22 LAB — TSH: TSH: 1.77 (ref 0.41–5.90)

## 2018-05-22 LAB — IRON,TIBC AND FERRITIN PANEL: Iron: 140

## 2018-09-23 DIAGNOSIS — A059 Bacterial foodborne intoxication, unspecified: Secondary | ICD-10-CM | POA: Diagnosis not present

## 2018-09-23 DIAGNOSIS — K529 Noninfective gastroenteritis and colitis, unspecified: Secondary | ICD-10-CM | POA: Diagnosis not present

## 2018-10-04 ENCOUNTER — Ambulatory Visit (INDEPENDENT_AMBULATORY_CARE_PROVIDER_SITE_OTHER): Payer: BLUE CROSS/BLUE SHIELD | Admitting: Family Medicine

## 2018-10-04 ENCOUNTER — Other Ambulatory Visit: Payer: Self-pay

## 2018-10-04 ENCOUNTER — Encounter: Payer: Self-pay | Admitting: Family Medicine

## 2018-10-04 VITALS — BP 114/66 | HR 73 | Temp 99.6°F | Resp 16 | Ht 66.0 in | Wt 158.2 lb

## 2018-10-04 DIAGNOSIS — K921 Melena: Secondary | ICD-10-CM

## 2018-10-04 DIAGNOSIS — K219 Gastro-esophageal reflux disease without esophagitis: Secondary | ICD-10-CM

## 2018-10-04 DIAGNOSIS — K625 Hemorrhage of anus and rectum: Secondary | ICD-10-CM

## 2018-10-04 MED ORDER — OMEPRAZOLE 40 MG PO CPDR: 40.0000 mg | DELAYED_RELEASE_CAPSULE | Freq: Every day | ORAL | 2 refills | Status: DC

## 2018-10-04 MED ORDER — SUCRALFATE 1 G PO TABS: 1.0000 g | ORAL_TABLET | Freq: Three times a day (TID) | ORAL | 0 refills | Status: DC

## 2018-10-04 NOTE — Patient Instructions (Addendum)
Thank you for coming to the office today.  [facility]) [place],  [address] Phone: [phone]  I am not sure the exact cause of your abdominal pain, however I am concerned that one significant possibility could be uncontrolled Acid Reflux (GERD) and may have developed an Ulcer (Peptic Ulcer of stomach).  START Omeprazole 40mg  -  Prefer to take this med about 30 min before breakfast or 1st meal of day for next 4 to 8 weeks, may need longer  Take other prescribed medicine Carafate (Sucralfate) as needed up to 4 times daily (3 meals and bedtime)  to coat stomach lining to ease symptoms, if it helps reduce symptoms then it is more likely to be due to acid and/or ulcer.  DIET RECOMMENDATIONS - Avoid spicy, greasy, fried foods, also things like caffeine, dark chocolate, peppermint can worsen - Avoid large meals and late night snacks, also do not go more than 4-5 hours without a snack or meal (not eating will worsen reflux symptoms due to stomach acid) - You may also elevate the head of your bed at night to sleep at very slight incline to help reduce symptoms  If the problem improves but keeps coming back, we can discuss higher dose or longer course at next visit.  If symptoms are worsening or persistent despite treatment or develop any different severe esophagus or abdominal pain, unable to swallow solids or liquids, nausea, vomiting especially blood in vomit, fever/chills, or unintentional weight loss / no appetite, please follow-up sooner in office or seek more immediate medical attention at hospital Emergency Department.  Regarding other medicines:  - STOP taking Ibuprofen, Advil, Motrin, Goody's / BC powder - DO NOT take without discussing with your doctor. These medicines can put you at high risk for future bleeding.  If need pain medicine, may take Tylenol Extra Strength (Acetaminophen) 500mg  tabs - take 1 to 2 tabs per  dose (max 1000mg ) every 6-8 hours for pain (take regularly, don't skip a dose for next 7 days), max 24 hour daily dose is 6 tablets or 3000mg . In the future you can repeat the same everyday Tylenol course for 1-2 weeks at a time.   Please schedule a Follow-up Appointment to: Return in about 6 weeks (around 11/15/2018) for F/u GI - GERD Gastritis, rectal bleeding.  If you have any other questions or concerns, please feel free to call the office or send a message through [place]. You may also schedule an earlier appointment if necessary.  Additionally, you may be receiving a survey about your experience at our office within a few days to 1 week by e-mail or mail. We value your feedback.  Nobie Putnam, DO [place]

## 2018-10-04 NOTE — Progress Notes (Signed)
Subjective:    Patient ID: Julie Oconnor, female    DOB: 12-15-1980, 37 y.o.   MRN: [id number]  Julie Oconnor is a 38 y.o. female presenting on 10/04/2018 for [place] (interpreter ID [phone] --Judeth Porch onset 2 weeks diarrhea, nausea, dizzy, rectal bleeding)  Interpreter Adelina ID [phone] Language line interpreter on speaker phone during visit.  Additionally history provided by patient's husband present today.  HPI  RECTAL BLEEDING / GERD vs Gastritis Reports symptoms onset for past 2 weeks with nausea, dizziness, diarrhea, and rectal bleeding. - Describes she vomited about 3-4 times initially and diarrhea occurred 3-4 times and dizziness had resolved since that time now for >5 days. Recently developed some bleeding in stool following the diarrhea, last episode was Sunday past, has had x 2 episodes, described small amount, reviewed picture with some red blood mixed in and some small. - Additionally she did go to Urgent Care recently around time of onset symptoms, and was rx Bactrim antibiotic with some improvement at that time. - Admits stomach inflamed and some gas production, worse with eating can have upset stomach and inflammation. - Not tried any OTC medications. No longer taking NSAID. - Prior history many years ago, had issue with similar rectal bleeding and it resolved. Now it has recurred and it is concerning her. She has never had colonoscopy before - Admits melena and occasional hematochezia - Denies any abdominal or rectal pain, constipation, nausea vomiting     Depression screen PHQ 2/9 10/04/2018  Decreased Interest 0  Down, Depressed, Hopeless 0  PHQ - 2 Score 0    Past Medical History:  Diagnosis Date  . Medical history non-contributory    Past Surgical History:  Procedure Laterality Date  . BILATERAL SALPINGECTOMY Bilateral 09/25/2017   Procedure: BILATERAL SALPINGECTOMY;  Surgeon: Schermerhorn, Gwen Her, MD;  Location: [facility] ORS;   Service: Gynecology;  Laterality: Bilateral;  . NO PAST SURGERIES    . VAGINAL HYSTERECTOMY N/A 09/25/2017   Procedure: HYSTERECTOMY VAGINAL;  Surgeon: Schermerhorn, Gwen Her, MD;  Location: [facility] ORS;  Service: Gynecology;  Laterality: N/A;   Social History   Socioeconomic History  . Marital status: Married    Spouse name: Not on file  . Number of children: Not on file  . Years of education: Not on file  . Highest education level: Not on file  Occupational History  . Not on file  Social Needs  . Financial resource strain: Not on file  . Food insecurity:    Worry: Not on file    Inability: Not on file  . Transportation needs:    Medical: Not on file    Non-medical: Not on file  Tobacco Use  . Smoking status: Never Smoker  . Smokeless tobacco: Never Used  Substance and Sexual Activity  . Alcohol use: Yes    Frequency: Never    Comment: occasional  . Drug use: Never  . Sexual activity: Not on file  Lifestyle  . Physical activity:    Days per week: Not on file    Minutes per session: Not on file  . Stress: Not on file  Relationships  . Social connections:    Talks on phone: Not on file    Gets together: Not on file    Attends religious service: Not on file    Active member of club or organization: Not on file    Attends meetings of clubs or organizations: Not on file    Relationship status: Not on  file  . Intimate partner violence:    Fear of current or ex partner: Not on file    Emotionally abused: Not on file    Physically abused: Not on file    Forced sexual activity: Not on file  Other Topics Concern  . Not on file  Social History Narrative  . Not on file   Family History  Problem Relation Age of Onset  . Heart disease Mother   . Breast cancer Neg Hx    Current Outpatient Medications on File Prior to Visit  Medication Sig  . IRON-FOLIC ACID PO Take 1 tablet by mouth daily.   . fluticasone (FLONASE) 50 MCG/ACT nasal spray SPRAY 2 SPRAYS INTO EACH NOSTRIL  EVERY DAY  . meclizine (ANTIVERT) 25 MG tablet Take 25 mg by mouth 2 (two) times daily as needed.   No current facility-administered medications on file prior to visit.     Review of Systems Per HPI unless specifically indicated above     Objective:    BP 114/66   Pulse 73   Temp 99.6 F (37.6 C) (Oral)   Resp 16   Ht 5\' 6"  (1.676 m)   Wt 158 lb 3.2 oz (71.8 kg)   BMI 25.53 kg/m   Wt Readings from Last 3 Encounters:  10/04/18 158 lb 3.2 oz (71.8 kg)  09/25/17 154 lb (69.9 kg)  09/14/17 154 lb (69.9 kg)    Physical Exam Vitals signs and nursing note reviewed.  Constitutional:      General: She is not in acute distress.    Appearance: She is well-developed. She is not diaphoretic.     Comments: Well-appearing, comfortable, cooperative  HENT:     Head: Normocephalic and atraumatic.  Eyes:     General:        Right eye: No discharge.        Left eye: No discharge.     Conjunctiva/sclera: Conjunctivae normal.  Neck:     Musculoskeletal: Normal range of motion and neck supple.     Thyroid: No thyromegaly.  Cardiovascular:     Rate and Rhythm: Normal rate and regular rhythm.     Heart sounds: Normal heart sounds. No murmur.  Pulmonary:     Effort: Pulmonary effort is normal. No respiratory distress.     Breath sounds: Normal breath sounds. No wheezing or rales.  Abdominal:     General: Bowel sounds are normal. There is no distension.     Palpations: Abdomen is soft. There is no mass.     Tenderness: There is no abdominal tenderness. There is no guarding or rebound.  Genitourinary:    Comments: DECLINED Rectal exam today Musculoskeletal: Normal range of motion.     Comments: No CVAT or Flank Pain  Lymphadenopathy:     Cervical: No cervical adenopathy.  Skin:    General: Skin is warm and dry.     Findings: No erythema or rash.  Neurological:     Mental Status: She is alert and oriented to person, place, and time.  Psychiatric:        Behavior: Behavior normal.      Comments: Well groomed, good eye contact, normal speech and thoughts    Results for orders placed or performed in visit on 10/04/18  Iron, TIBC and Ferritin Panel  Result Value Ref Range   Iron 140   CBC and differential  Result Value Ref Range   Platelets 316 150 - 399   WBC 5.8  VITAMIN D 25 Hydroxy (Vit-D Deficiency, Fractures)  Result Value Ref Range   Vit D, 25-Hydroxy 74.1   Basic metabolic panel  Result Value Ref Range   Glucose 75    BUN 11 4 - 21   Creatinine 0.7 0.5 - 1.1   Potassium 4.7 3.4 - 5.3   Sodium 140 137 - 147  Hepatic function panel  Result Value Ref Range   Alkaline Phosphatase 60 25 - 125   ALT 17 7 - 35   AST 18 13 - 35   Bilirubin, Total 0.4   TSH  Result Value Ref Range   TSH 1.77 0.41 - 5.90  CBC and differential  Result Value Ref Range   Hemoglobin 12.0 12.0 - 16.0   HCT 37 36 - 46      Assessment & Plan:   Problem List Items Addressed This Visit    None    Visit Diagnoses    Hematochezia    -  Primary   Relevant Orders   Ambulatory referral to Gastroenterology   Rectal bleeding       Relevant Orders   Ambulatory referral to Gastroenterology   Melena       Relevant Orders   Ambulatory referral to Gastroenterology   Gastroesophageal reflux disease, esophagitis presence not specified       Relevant Medications   meclizine (ANTIVERT) 25 MG tablet   omeprazole (PRILOSEC) 40 MG capsule   sucralfate (CARAFATE) 1 g tablet      Clinically with recurrent episodes of rectal bleeding, has described and showed pictures of both melena and hematochezia, seems episodic x 1 weekly for recently, and in past had similar episode years ago. No clear history of hemorrhoids or any rectal pain or symptoms. Did have acute GI illness onset recently that seemed to have flared up or trigger her bleeding. That has mostly resolved now. - Still has chronic episodic issue with generalized abdominal discomfort vs inflammation - consider GERD vs Gastritis   Plan - Start with empiric therapy Omeprazole PPI 40mg  daily before breakfast for 4-8 weeks as a trial - Add Sucralfate PRN mealtime symptoms - Referral to [facility] for further evaluation diagnostic with rectal bleeding / GIB symptoms given this is recurrent episode now and years previous, patient is requesting further evaluation and may warrant diagnostic testing  Declined nausea medicine or other symptom relief since she is improved  Return criteria given if worsening. Handout reviewed with patient and husband, w/ interpreter   Meds ordered this encounter  Medications  . omeprazole (PRILOSEC) 40 MG capsule    Sig: Take 1 capsule (40 mg total) by mouth daily before breakfast.    Dispense:  30 capsule    Refill:  2    Spanish label requested  . sucralfate (CARAFATE) 1 g tablet    Sig: Take 1 tablet (1 g total) by mouth 4 (four) times daily -  with meals and at bedtime. Only as needed for abdominal pain    Dispense:  60 tablet    Refill:  0    Spanish label requested   Orders Placed This Encounter  Procedures  . Iron, TIBC and Ferritin Panel    This external order was created through the Results Console.  . CBC and differential    This external order was created through the Results Console.  Marland Kitchen VITAMIN D 25 Hydroxy (Vit-D Deficiency, Fractures)    This external order was created through the Results Console.  . Basic metabolic panel  This external order was created through the Results Console.  . Hepatic function panel    This external order was created through the Results Console.  . TSH    This external order was created through the Results Console.  . CBC and differential    This external order was created through the Results Console.  . Ambulatory referral to Gastroenterology    Referral Priority:   Routine    Referral Type:   Consultation    Referral Reason:   Specialty Services Required    Number of Visits Requested:   1      Follow up plan: Return in about  6 weeks (around 11/15/2018) for F/u GI - GERD Gastritis, rectal bleeding.  Nobie Putnam, DO [facility] 10/04/2018, 10:36 AM

## 2018-10-11 ENCOUNTER — Encounter: Payer: Self-pay | Admitting: *Deleted

## 2018-10-26 ENCOUNTER — Other Ambulatory Visit: Payer: Self-pay | Admitting: Family Medicine

## 2018-10-26 DIAGNOSIS — K219 Gastro-esophageal reflux disease without esophagitis: Secondary | ICD-10-CM

## 2018-11-08 ENCOUNTER — Ambulatory Visit: Payer: BLUE CROSS/BLUE SHIELD | Admitting: Gastroenterology

## 2018-11-08 DIAGNOSIS — Z01419 Encounter for gynecological examination (general) (routine) without abnormal findings: Secondary | ICD-10-CM | POA: Diagnosis not present

## 2018-11-08 DIAGNOSIS — Z1389 Encounter for screening for other disorder: Secondary | ICD-10-CM | POA: Diagnosis not present

## 2018-11-08 DIAGNOSIS — Z1211 Encounter for screening for malignant neoplasm of colon: Secondary | ICD-10-CM | POA: Diagnosis not present

## 2018-11-08 DIAGNOSIS — Z1239 Encounter for other screening for malignant neoplasm of breast: Secondary | ICD-10-CM | POA: Diagnosis not present

## 2018-11-20 DIAGNOSIS — Z1211 Encounter for screening for malignant neoplasm of colon: Secondary | ICD-10-CM | POA: Diagnosis not present

## 2018-11-21 ENCOUNTER — Ambulatory Visit: Payer: BLUE CROSS/BLUE SHIELD | Admitting: Family Medicine

## 2018-12-19 ENCOUNTER — Encounter: Payer: Self-pay | Admitting: Gastroenterology

## 2018-12-19 ENCOUNTER — Ambulatory Visit: Payer: BC Managed Care – PPO | Admitting: Gastroenterology

## 2018-12-19 ENCOUNTER — Other Ambulatory Visit: Payer: Self-pay

## 2018-12-19 ENCOUNTER — Encounter (INDEPENDENT_AMBULATORY_CARE_PROVIDER_SITE_OTHER): Payer: Self-pay

## 2018-12-19 VITALS — BP 121/77 | HR 64 | Temp 98.4°F | Resp 16 | Ht 66.0 in | Wt 154.8 lb

## 2018-12-19 DIAGNOSIS — K625 Hemorrhage of anus and rectum: Secondary | ICD-10-CM | POA: Diagnosis not present

## 2018-12-19 DIAGNOSIS — K5900 Constipation, unspecified: Secondary | ICD-10-CM | POA: Diagnosis not present

## 2018-12-19 NOTE — Patient Instructions (Signed)
Dieta rica en fibra  High-Fiber Diet  La fibra, tambin llamada fibra dietaria, es un tipo de carbohidrato que se encuentra en las frutas, las verduras, los cereales integrales y los frijoles. Una dieta rica en fibra puede tener muchos beneficios para la salud. El mdico puede recomendar una dieta rica en fibra para ayudar a:   Evitar el estreimiento. La fibra puede hacer que defeque con ms frecuencia.   Disminuir el nivel de colesterol.   Aliviar las siguientes afecciones:  ? Hinchazn de la venas en el ano (hemorroides).  ? Hinchazn e irritacin (inflamacin) de zonas especficas del tracto digestivo (diverticulitis sin complicaciones).  ? Un problema del intestino grueso (colon) que, a veces, causa dolor y diarrea (sndrome de colon irritable, SCI).   Evitar comer en exceso como parte de un plan para bajar de peso.   Evitar la enfermedad cardaca, la diabetes tipo 2 y ciertos cnceres.  En qu consiste el plan?  El consumo diario recomendado de fibra en gramos (g) incluye lo siguiente:   38g para hombres de 50 aos o menos.   30g para los hombres mayores de 50 aos.   25g para mujeres de 50 aos o menos.   21g para las mujeres mayores de 50 aos.  Puede recibir la ingesta diaria recomendada de fibra dietaria de la siguiente manera:   Coma una variedad de frutas, verduras, granos y frijoles.   Tome un suplemento de fibra, si no es posible comer suficiente fibra en su dieta.  Qu debo saber acerca de la dieta rica en fibra?   Es mejor obtener fibra directamente de los alimentos en lugar de recibirla de suplementos de fibra. No hay demasiada investigacin acerca de qu tan efectivos son los suplementos.   Verifique siempre el contenido de fibra en la etiqueta de informacin nutricional de los alimentos preenvasados. Busque alimentos que contengan 5g o ms de fibra por porcin.   Hable con un especialista en alimentacin y nutricin (nutricionista) si tiene preguntas sobre alimentos  especficos que se recomiendan o no para su afeccin mdica, especialmente si aquellos alimentos no estn detallados a continuacin.   Aumente gradualmente la cantidad de fibra que consume. Si aumenta demasiado rpido el consumo de fibra dietaria puede provocar distensin abdominal, clicos o gases.   Beba abundante agua. El agua ayuda a digerir la fibra.  Cules son algunos consejos para seguir este plan?   Consuma una gran variedad de alimentos ricos en fibra.   Asegrese de que al menos la mitad de los granos que ingiere cada da sean cereales integrales.   Coma panes y cereales hechos con harina de cereales integrales en lugar de harina refinada o blanca.   Coma arroz integral, trigo burgol o mijo en lugar de arroz blanco.   Comience el da con un desayuno rico en fibra, como un cereal que contenga al menos 5g de fibra o ms por porcin.   Use frijoles en lugar de carne en las sopas, ensaladas o platos de pastas.   Coma colaciones ricas en fibra, como frutos rojos, verduras crudas, frutos secos y palomitas de maz.   Elija frutas y verduras enteras en lugar de formas procesadas como jugo o salsa.  Qu alimentos puedo comer?    Frutas  Frutos rojos. Peras. Manzanas. Naranjas. [place]. Ciruelas y pasas. Higos secos.  Verduras  Batatas. [place]spinaca. Col rizada. Alcachofas. Repollo. Brcoli. Coliflor. Guisantes. Zanahorias. [place].  Cereales  Panes integrales. Cereal multigrano. Avena. Arroz integral. Cebada. Trigo burgol. Mijo. Quinua. Magdalenas   [place]. Es posible que los productos mencionados arriba no formen una lista completa de las bebidas y los alimentos recomendados.  Comunquese con un nutricionista para conocer ms opciones. Qu alimentos no se recomiendan? Frutas Jugo de frutas. Frutas cocidas coladas. Verduras Papas fritas. Verduras enlatadas. Verduras muy cocidas. Cereales Pan blanco. Pastas hechas con Letitia Neri. Arroz Kerby Moors y otras protenas Cortes de carne con grasa. Pollo o pescado fritos. Lcteos Leche. Yogur. Queso crema. [employer]. Grasas y [employer]. Bebidas Gaseosas. Otros alimentos Tortas y pasteles. Es posible que los productos que se enumeran ms arriba no sean una lista completa de los alimentos y las bebidas que se Higher education careers adviser. Comunquese con un nutricionista para obtener ms informacin. Resumen  La fibra es un tipo de carbohidrato. Se encuentra en las frutas, las verduras, los cereales integrales y los frijoles.  Una dieta rica en fibra representa muchos beneficios para la salud, como evitar el estreimiento, Management consultant colesterol en la [employer], ayudar a perder peso y reducir el riesgo de enfermedad cardaca, diabetes y ciertos tipos de cncer.  Aumente gradualmente la ingesta de fibra. El aumento demasiado rpido puede provocar clicos, distensin abdominal y gases. Beba mucha agua a la vez que [employer].  Las mejores fuentes de fibra incluyen frutas y verduras enteras, granos integrales, frutos secos, semillas y frijoles. Esta informacin no tiene Marine scientist el consejo del mdico. Asegrese de hacerle al mdico cualquier pregunta que tenga. Document Released: 05/02/2005 Document Revised: 08/12/2017 Document Reviewed: 04/21/2017 Elsevier Patient Education  2020 [employer].

## 2018-12-20 NOTE — Progress Notes (Signed)
Julie Oconnor [address]  [place]  [place], [place] [address]  Main: [phone]  Fax: [phone]   Gastroenterology Consultation  Referring Provider:     Nobie Putnam * Primary Care Physician:  Olin Hauser, DO Reason for Consultation:    Blood in stool        HPI:    Chief Complaint  Patient presents with  . New Patient (Initial Visit)  . Rectal Bleeding  . Bloated    Julie Oconnor is a 38 y.o. y/o female referred for consultation & management  by Dr. Parks Ranger, Devonne Doughty, DO.  Patient evaluated with a Spanish interpreter present in the room.  Patient understands English well, but requested a Spanish interpreter for assistance.  Entire conversation took place with help of Spanish interpreter the entire time.  Patient reports, in May 2020 she had an episode of blood in stool, maroon to bright red in color and lasted 2 to 3 days and then self resolved.  She reports eating food at a family reunion right before the symptoms started.  The symptoms then completely resolved and have not reoccurred since then.  Reports one soft bowel movement daily.  Does report straining at times.  In June 2020 had one episode where she saw bright red blood on the tissue paper only, small in quantity.  But no blood in stool at the time.  The patient denies abdominal or flank pain, anorexia, nausea or vomiting, dysphagia, change in bowel habits or black stools or weight loss.  No family history of colon cancer.  No prior EGD or colonoscopy.   Past Medical History:  Diagnosis Date  . Medical history non-contributory     Past Surgical History:  Procedure Laterality Date  . BILATERAL SALPINGECTOMY Bilateral 09/25/2017   Procedure: BILATERAL SALPINGECTOMY;  Surgeon: Schermerhorn, Gwen Her, MD;  Location: [facility] ORS;  Service: Gynecology;  Laterality: Bilateral;  . NO PAST SURGERIES    . VAGINAL HYSTERECTOMY N/A 09/25/2017   Procedure: HYSTERECTOMY  VAGINAL;  Surgeon: Schermerhorn, Gwen Her, MD;  Location: [facility] ORS;  Service: Gynecology;  Laterality: N/A;    Prior to Admission medications   Medication Sig Start Date End Date Taking? Authorizing Provider  fluticasone (FLONASE) 50 MCG/ACT nasal spray SPRAY 2 SPRAYS INTO EACH NOSTRIL EVERY DAY 09/26/18  Yes [provider]  IRON-FOLIC ACID PO Take 1 tablet by mouth daily.     [provider]  meclizine (ANTIVERT) 25 MG tablet Take 25 mg by mouth 2 (two) times daily as needed. 09/26/18   [provider]  omeprazole (PRILOSEC) 40 MG capsule TAKE 1 CAPSULE BY MOUTH EVERY DAY BEFORE BREAKFAST Patient not taking: Reported on 12/19/2018 10/26/18   Olin Hauser, DO  sucralfate (CARAFATE) 1 g tablet Take 1 tablet (1 g total) by mouth 4 (four) times daily -  with meals and at bedtime. Only as needed for abdominal pain Patient not taking: Reported on 12/19/2018 10/04/18   Olin Hauser, DO    Family History  Problem Relation Age of Onset  . Heart disease Mother   . Breast cancer Neg Hx      Social History   Tobacco Use  . Smoking status: Never Smoker  . Smokeless tobacco: Never Used  Substance Use Topics  . Alcohol use: Yes    Frequency: Never    Comment: occasional  . Drug use: Never    Allergies as of 12/19/2018  . (No Known Allergies)    Review  of Systems:    All systems reviewed and negative except where noted in HPI.   Physical Exam:  BP 121/77 (BP Location: Left Arm, Patient Position: Sitting, Cuff Size: Large)   Pulse 64   Temp 98.4 F (36.9 C)   Resp 16   Ht 5\' 6"  (1.676 m)   Wt 154 lb 12.8 oz (70.2 kg)   BMI 24.99 kg/m  No LMP recorded. Psych:  Alert and cooperative. Normal mood and affect. General:   Alert,  Well-developed, well-nourished, pleasant and cooperative in NAD Head:  Normocephalic and atraumatic. Eyes:  Sclera clear, no icterus.   Conjunctiva pink. Ears:  Normal auditory acuity. Nose:  No deformity,  discharge, or lesions. Mouth:  No deformity or lesions,oropharynx pink & moist. Neck:  Supple; no masses or thyromegaly. Abdomen:  Normal bowel sounds.  No bruits.  Soft, non-tender and non-distended without masses, hepatosplenomegaly or hernias noted.  No guarding or rebound tenderness.    Msk:  Symmetrical without gross deformities. Good, equal movement & strength bilaterally. Pulses:  Normal pulses noted. Extremities:  No clubbing or edema.  No cyanosis. Neurologic:  Alert and oriented x3;  grossly normal neurologically. Skin:  Intact without significant lesions or rashes. No jaundice. Lymph Nodes:  No significant cervical adenopathy. Psych:  Alert and cooperative. Normal mood and affect.   Labs: CBC    Component Value Date/Time   WBC 5.8 05/22/2018   WBC 8.8 09/26/2017 0532   RBC 3.90 09/26/2017 0532   HGB 12.0 05/22/2018   HCT 37 05/22/2018   PLT 316 05/22/2018   MCV 66.2 (L) 09/26/2017 0532   MCH 20.3 (L) 09/26/2017 0532   MCHC 30.6 (L) 09/26/2017 0532   RDW 17.4 (H) 09/26/2017 0532   CMP     Component Value Date/Time   NA 140 05/22/2018   K 4.7 05/22/2018   CL 103 09/26/2017 0532   CO2 27 09/26/2017 0532   GLUCOSE 93 09/26/2017 0532   BUN 11 05/22/2018   CREATININE 0.7 05/22/2018   CREATININE 0.68 09/26/2017 0532   CALCIUM 8.5 (L) 09/26/2017 0532   AST 18 05/22/2018   ALT 17 05/22/2018   ALKPHOS 60 05/22/2018   GFRNONAA >60 09/26/2017 0532   GFRAA >60 09/26/2017 0532    Imaging Studies: No results found.  Assessment and Plan:   Julie Oconnor is a 38 y.o. y/o female has been referred for isolated episode of blood in stool in May 2020, and minute amount of blood on toilet paper only in June 2020  Patient's May 2020 episode is most likely consistent with gastroenteritis that self resolved This has not reoccurred  The minute amount of blood she saw on tissue paper in June 2020 is likely due to underlying hemorrhoids, given her constipation   High-fiber diet MiraLAX or Metamucil daily with goal of 1-2 soft bowel movements daily.  If not at goal, patient instructed to increase dose to twice daily.  If loose stools with the medication, patient asked to decrease the medication to every other day, or half dose daily.  Patient verbalized understanding  We did discuss that blood in stool is likely hemorrhoids, but large polyps or cancer cannot be ruled out completely until the colonoscopy is done.  Patient is not interested in a colonoscopy at this time.  And understands the risk of underlying malignancy if one is not done.  If symptoms recur I have asked her to notify us and she verbalized understanding  Her lab work is reassuring as  well  I did inform her that she would be due for screening colonoscopy at 38 years of age and should get one done by then at the latest.  She verbalized understanding  I have encouraged her to call us with any questions or concerns    Dr Julie Oconnor  Speech recognition software was used to dictate the above note.

## 2019-06-04 DIAGNOSIS — J209 Acute bronchitis, unspecified: Secondary | ICD-10-CM | POA: Diagnosis not present

## 2019-06-04 DIAGNOSIS — Z0189 Encounter for other specified special examinations: Secondary | ICD-10-CM | POA: Diagnosis not present

## 2019-06-04 DIAGNOSIS — R05 Cough: Secondary | ICD-10-CM | POA: Diagnosis not present

## 2019-08-21 DIAGNOSIS — Z23 Encounter for immunization: Secondary | ICD-10-CM | POA: Diagnosis not present

## 2019-11-12 ENCOUNTER — Other Ambulatory Visit: Payer: Self-pay | Admitting: Obstetrics and Gynecology

## 2019-11-12 DIAGNOSIS — Z1231 Encounter for screening mammogram for malignant neoplasm of breast: Secondary | ICD-10-CM | POA: Diagnosis not present

## 2019-11-12 DIAGNOSIS — Z01419 Encounter for gynecological examination (general) (routine) without abnormal findings: Secondary | ICD-10-CM | POA: Diagnosis not present

## 2019-11-12 DIAGNOSIS — Z1211 Encounter for screening for malignant neoplasm of colon: Secondary | ICD-10-CM | POA: Diagnosis not present

## 2019-11-22 DIAGNOSIS — Z1211 Encounter for screening for malignant neoplasm of colon: Secondary | ICD-10-CM | POA: Diagnosis not present

## 2019-12-10 ENCOUNTER — Encounter: Payer: Self-pay | Admitting: Radiology

## 2019-12-10 ENCOUNTER — Ambulatory Visit
Admission: RE | Admit: 2019-12-10 | Discharge: 2019-12-10 | Disposition: A | Discharge status: Home / Self Care | Payer: BC Managed Care – PPO | Source: Ambulatory Visit | Attending: Obstetrics and Gynecology | Admitting: Obstetrics and Gynecology

## 2019-12-10 DIAGNOSIS — Z1231 Encounter for screening mammogram for malignant neoplasm of breast: Secondary | ICD-10-CM | POA: Insufficient documentation

## 2019-12-12 ENCOUNTER — Inpatient Hospital Stay
Admission: RE | Admit: 2019-12-12 | Discharge: 2019-12-12 | Disposition: A | Discharge status: Home / Self Care | Payer: Self-pay | Source: Ambulatory Visit | Attending: *Deleted | Admitting: *Deleted

## 2019-12-12 ENCOUNTER — Other Ambulatory Visit: Payer: Self-pay | Admitting: *Deleted

## 2019-12-12 DIAGNOSIS — Z1231 Encounter for screening mammogram for malignant neoplasm of breast: Secondary | ICD-10-CM

## 2020-02-06 DIAGNOSIS — Z23 Encounter for immunization: Secondary | ICD-10-CM | POA: Diagnosis not present

## 2020-02-13 DIAGNOSIS — K625 Hemorrhage of anus and rectum: Secondary | ICD-10-CM | POA: Diagnosis not present

## 2020-02-13 DIAGNOSIS — R195 Other fecal abnormalities: Secondary | ICD-10-CM | POA: Diagnosis not present

## 2020-02-13 DIAGNOSIS — K5900 Constipation, unspecified: Secondary | ICD-10-CM | POA: Diagnosis not present

## 2020-04-01 ENCOUNTER — Other Ambulatory Visit: Payer: BC Managed Care – PPO | Attending: Gastroenterology

## 2020-04-03 ENCOUNTER — Ambulatory Visit: Admission: RE | Admit: 2020-04-03 | Payer: BC Managed Care – PPO | Source: Home / Self Care

## 2020-04-03 ENCOUNTER — Encounter: Admission: RE | Payer: Self-pay | Source: Home / Self Care

## 2020-04-03 SURGERY — COLONOSCOPY WITH PROPOFOL
Anesthesia: General

## 2021-05-25 ENCOUNTER — Other Ambulatory Visit: Payer: Self-pay | Admitting: Obstetrics and Gynecology

## 2021-05-25 DIAGNOSIS — Z1231 Encounter for screening mammogram for malignant neoplasm of breast: Secondary | ICD-10-CM

## 2021-07-08 ENCOUNTER — Other Ambulatory Visit: Payer: Self-pay

## 2021-07-08 ENCOUNTER — Ambulatory Visit
Admission: RE | Admit: 2021-07-08 | Discharge: 2021-07-08 | Disposition: A | Discharge status: Home / Self Care | Payer: 59 | Source: Ambulatory Visit | Attending: Obstetrics and Gynecology | Admitting: Obstetrics and Gynecology

## 2021-07-08 DIAGNOSIS — Z1231 Encounter for screening mammogram for malignant neoplasm of breast: Secondary | ICD-10-CM | POA: Insufficient documentation

## 2023-02-21 ENCOUNTER — Other Ambulatory Visit: Payer: Self-pay | Admitting: Obstetrics and Gynecology

## 2023-02-21 DIAGNOSIS — N63 Unspecified lump in unspecified breast: Secondary | ICD-10-CM

## 2023-03-03 ENCOUNTER — Ambulatory Visit
Admission: RE | Admit: 2023-03-03 | Discharge: 2023-03-03 | Disposition: A | Discharge status: Home / Self Care | Payer: 59 | Source: Ambulatory Visit | Attending: Obstetrics and Gynecology | Admitting: Obstetrics and Gynecology

## 2023-03-03 DIAGNOSIS — N63 Unspecified lump in unspecified breast: Secondary | ICD-10-CM | POA: Diagnosis present

## 2023-07-03 ENCOUNTER — Ambulatory Visit: Payer: 59 | Admitting: Family Medicine

## 2023-07-03 ENCOUNTER — Encounter: Payer: Self-pay | Admitting: Family Medicine

## 2023-07-03 VITALS — BP 124/80 | HR 79 | Ht 66.0 in | Wt 157.0 lb

## 2023-07-03 DIAGNOSIS — F331 Major depressive disorder, recurrent, moderate: Secondary | ICD-10-CM | POA: Diagnosis not present

## 2023-07-03 DIAGNOSIS — F419 Anxiety disorder, unspecified: Secondary | ICD-10-CM

## 2023-07-03 DIAGNOSIS — F3342 Major depressive disorder, recurrent, in full remission: Secondary | ICD-10-CM | POA: Insufficient documentation

## 2023-07-03 MED ORDER — ESCITALOPRAM OXALATE 5 MG PO TABS: 5.0000 mg | ORAL_TABLET | Freq: Every day | ORAL | 2 refills | Status: DC

## 2023-07-03 NOTE — Patient Instructions (Addendum)
 Thank you for coming to the office today.  Start medication Escitalopram 5mg  daily with meal, this is an anti depression / anxiety medication. Keep it consistent every day, some mild digestive upset at first but it should resolve.  We will refer you to a Spanish Speaking Therapist. They will call you to schedule.  These offices have both PSYCHIATRY doctors and THERAPISTS  [facility] (Virtual Available) [place] [address] [place], [place] [address] Phone: [phone]  [facility] Address: [address] [place], [place] [address] [email or web address] Phone: [phone]  [facility]) Address: [address], [place], [place] [address] Hours: 8:30AM-5PM Phone: [phone]  [facility] (Adult, Peds, Geriatric, Counseling) [address] [place], [place] [address] Phone: [phone] Fax: [phone]  [facility] at [facility] [address] [place], [place] [address] Phone: [phone]  [facility] (All ages) [address], [address] [place], [id number] Phone: [phone] (Option 1) [email or web address]  ----------------------------------------------------------------- THERAPIST ONLY  (No Psychiatry)  [facility] [address] [place], [place] [address] [place]: [phone]  [facility]) [facility], [facility] [address] [place], [place] [address] [phone]  [facility]   Address: [address] [place], [place] [address] Hours: Open today  9AM-7PM Phone: [phone]  [facility] [address], [facility]  - [facility] Address: [address] [place], [place] [address] Phone: [phone]  [facility] [place], [place] [address] Phone:  [phone]   Please schedule a Follow-up Appointment to: Return in about 4 weeks (around 07/31/2023) for 4 week follow-up depression/anxiety, updates.  If you have any other questions or concerns, please feel free to call the office or send a message through MyChart. You may also schedule an earlier appointment if necessary.  Additionally, you may be receiving a survey about your experience at our office within a few days to 1 week by e-mail or mail. We value your feedback.  Saralyn Pilar, DO [facility], [place]

## 2023-07-03 NOTE — Progress Notes (Signed)
 Subjective:    Patient ID: Julie Oconnor, female    DOB: Feb 06, 1981, 43 y.o.   MRN: [id number]  Julie Oconnor is a 43 y.o. female presenting on 07/03/2023 for Depression and Anxiety   HPI  Discussed the use of AI scribe software for clinical note transcription with the patient, who gave verbal consent to proceed.  History provided by patient and husband.  History of Present Illness   Julie Oconnor is a 43 year old female who presents with emotional distress following her son's departure from home. She is accompanied by her partner.  She is experiencing emotional distress following her son's departure from home one year ago. She feels down, cries easily, and has mood swings. These symptoms have persisted since her son moved out to live with friends and attend college. She rarely sees him, which contributes to her distress. She describes her mood as mostly sad, with episodes of crying and feeling down, and experiences anxiety, feeling stressed and irritable. She recalls experiencing similar emotions approximately 28 years ago following the loss of her first son but does not believe this is related to her current situation. No previous use of medications for mental health issues.  She has a history of a hysterectomy performed five years ago, which has resulted in hormonal changes. Her menstrual cycles have ceased since the procedure. She mentions that her friends have suggested that her symptoms might be related to hormonal changes.    Past Surgical History:  Procedure Laterality Date   BILATERAL SALPINGECTOMY Bilateral 09/25/2017   Procedure: BILATERAL SALPINGECTOMY;  Surgeon: Schermerhorn, Ihor Austin, MD;  Location: [facility] OR[facility];  Service: Gynecology;  Laterality: Bilateral;   NO PAST SURGERIES     VAGINAL HYSTERECTOMY N/A 09/25/2017   Procedure: HYSTERECTOMY VAGINAL;  Surgeon: Schermerhorn, Ihor Austin, MD;  Location: [facility] OR[facility];  Service: Gynecology;  Laterality: N/A;           07/03/2023    9:34 AM 10/04/2018   10:21 AM  Depression screen PHQ 2/9  Decreased Interest 1 0  Down, Depressed, Hopeless 1 0  PHQ - 2 Score 2 0  Altered sleeping 1   Tired, decreased energy 1   Change in appetite 1   Feeling bad or failure about yourself  1   Trouble concentrating 0   Moving slowly or fidgety/restless 0   Suicidal thoughts 0   PHQ-9 Score 6   Difficult doing work/chores Very difficult        07/03/2023    9:37 AM  GAD 7 : Generalized Anxiety Score  Nervous, Anxious, on Edge 1  Control/stop worrying 1  Worry too much - different things 1  Trouble relaxing 1  Restless 0  Easily annoyed or irritable 1  Afraid - awful might happen 1  Total GAD 7 Score 6  Anxiety Difficulty Somewhat difficult    Social History   Tobacco Use   Smoking status: Never   Smokeless tobacco: Never  Vaping Use   Vaping status: Never Used  Substance Use Topics   Alcohol use: Yes    Comment: occasional   Drug use: Never    Review of Systems Per HPI unless specifically indicated above     Objective:    BP 124/80   Pulse 79   Ht 5\' 6"  (1.676 m)   Wt 157 lb (71.2 kg)   LMP 09/12/2017   SpO2 99%   BMI 25.34 kg/m   Wt Readings from Last 3 Encounters:  07/03/23 157  lb (71.2 kg)  12/19/18 154 lb 12.8 oz (70.2 kg)  10/04/18 158 lb 3.2 oz (71.8 kg)    Physical Exam Vitals and nursing note reviewed.  Constitutional:      General: She is not in acute distress.    Appearance: Normal appearance. She is well-developed. She is not diaphoretic.     Comments: Well-appearing, comfortable, cooperative  HENT:     Head: Normocephalic and atraumatic.  Eyes:     General:        Right eye: No discharge.        Left eye: No discharge.     Conjunctiva/sclera: Conjunctivae normal.  Cardiovascular:     Rate and Rhythm: Normal rate.  Pulmonary:     Effort: Pulmonary effort is normal.  Skin:    General: Skin is warm and dry.     Findings: No erythema or rash.   Neurological:     Mental Status: She is alert and oriented to person, place, and time.  Psychiatric:        Mood and Affect: Mood normal.        Behavior: Behavior normal.        Thought Content: Thought content normal.     Comments: Well groomed, good eye contact, normal speech and thoughts. Depressed affect     Results for orders placed or performed in visit on 10/04/18  Iron, TIBC and Ferritin Panel   Collection Time: 05/22/18 12:00 AM  Result Value Ref Range   Iron 140   CBC and differential   Collection Time: 05/22/18 12:00 AM  Result Value Ref Range   Platelets 316 150 - 399   WBC 5.8   VITAMIN D 25 Hydroxy (Vit-D Deficiency, Fractures)   Collection Time: 05/22/18 12:00 AM  Result Value Ref Range   Vit D, 25-Hydroxy 14.9   Basic metabolic panel   Collection Time: 05/22/18 12:00 AM  Result Value Ref Range   Glucose 75    BUN 11 4 - 21   Creatinine 0.7 0.5 - 1.1   Potassium 4.7 3.4 - 5.3   Sodium 140 137 - 147  Hepatic function panel   Collection Time: 05/22/18 12:00 AM  Result Value Ref Range   Alkaline Phosphatase 60 25 - 125   ALT 17 7 - 35   AST 18 13 - 35   Bilirubin, Total 0.4   TSH   Collection Time: 05/22/18 12:00 AM  Result Value Ref Range   TSH 1.77 0.41 - 5.90  CBC and differential   Collection Time: 05/22/18 12:00 AM  Result Value Ref Range   Hemoglobin 12.0 12.0 - 16.0   HCT 37 36 - 46      Assessment & Plan:   Problem List Items Addressed This Visit     Major depressive disorder, recurrent, moderate (HCC) - Primary   Relevant Medications   escitalopram (LEXAPRO) 5 MG tablet   Other Relevant Orders   Ambulatory referral to Psychology   Other Visit Diagnoses       Anxiety       Relevant Medications   escitalopram (LEXAPRO) 5 MG tablet   Other Relevant Orders   Ambulatory referral to Psychology        Major Depression recurrent  Anxiety  Symptoms worsened over the past year following son moving out.  Previous episode of  depression and anxiety following loss of first son 20 years ago. Possible hormonal influence post-hysterectomy. Patient prefers therapy over medication, but open to trying medication  while waiting for therapy.  -Start SSRI Lexapro 5mg  daily with meals. -Refer to a Spanish-speaking therapist, preferably in-person sessions. -Check-in after 4 weeks to assess medication tolerance and effectiveness.  Family Dynamics Strained relationship with son since she moved out, causing emotional distress. -Include family therapy in the treatment plan if deemed necessary by the therapist.        Orders Placed This Encounter  Procedures   Ambulatory referral to Psychology    Referral Priority:   Routine    Referral Type:   Psychiatric    Referral Reason:   Specialty Services Required    Requested Specialty:   Psychology    Number of Visits Requested:   1    Meds ordered this encounter  Medications   escitalopram (LEXAPRO) 5 MG tablet    Sig: Take 1 tablet (5 mg total) by mouth daily.    Dispense:  30 tablet    Refill:  2    Follow up plan: Return in about 4 weeks (around 07/31/2023) for 4 week follow-up depression/anxiety, updates.  Saralyn Pilar, DO [facility] 07/03/2023, 9:28 AM

## 2023-07-31 ENCOUNTER — Encounter: Payer: Self-pay | Admitting: Family Medicine

## 2023-07-31 ENCOUNTER — Ambulatory Visit: Payer: 59 | Admitting: Family Medicine

## 2023-07-31 VITALS — BP 122/68 | HR 86 | Ht 66.0 in | Wt 158.0 lb

## 2023-07-31 DIAGNOSIS — F419 Anxiety disorder, unspecified: Secondary | ICD-10-CM

## 2023-07-31 DIAGNOSIS — D500 Iron deficiency anemia secondary to blood loss (chronic): Secondary | ICD-10-CM

## 2023-07-31 DIAGNOSIS — F331 Major depressive disorder, recurrent, moderate: Secondary | ICD-10-CM | POA: Diagnosis not present

## 2023-07-31 NOTE — Patient Instructions (Addendum)
 Thank you for coming to the office today.  Labs today for iron anemia  Please call to check status of therapist / counselor appointment  Juanda Bond at [phone]   Or  Harvin Hazel [phone]  (VIRTUAL ONLY)  Keep taking current medication Escitalopram Lexapro 5mg  daily WITH Food.  If you feel like it is still making you nauseas then let me know and we can change the medication.  Please schedule a Follow-up Appointment to: Return in about 6 weeks (around 09/11/2023) for 6 weeks follow-up depression, anxiety.  If you have any other questions or concerns, please feel free to call the office or send a message through MyChart. You may also schedule an earlier appointment if necessary.  Additionally, you may be receiving a survey about your experience at our office within a few days to 1 week by e-mail or mail. We value your feedback.  Saralyn Pilar, DO [facility], [place]

## 2023-07-31 NOTE — Addendum Note (Signed)
 Addended by: Smitty Cords on: 07/31/2023 01:09 PM   Modules accepted: Level of Service

## 2023-07-31 NOTE — Progress Notes (Signed)
 Subjective:    Patient ID: Julie Oconnor, female    DOB: 1981-03-01, 43 y.o.   MRN: [id number]  Julie Oconnor is a 43 y.o. female presenting on 07/31/2023 for Depression, Anxiety, and Anemia   HPI  Discussed the use of AI scribe software for clinical note transcription with the patient, who gave verbal consent to proceed.  She declines Spanish Language interpreter today.  History of Present Illness   Julie Oconnor is a 43 year old female who presents with medication management and follow-up for depression and anxiety.  Last visit 07/03/23 initial treatment with SSRI Lexapro 5mg   She has been taking Lexapro 5 mg inconsistently, experiencing improvement in mood when taken but also mild nausea and an upset stomach as side effects. She takes the medication with food, but the nausea persists. She is concerned about the potential for addiction with Lexapro.  She feels overwhelmed and sometimes out of control, indicating that both depression and anxiety are affecting her.  She inquires about checking her iron levels due to a history of low iron, attributed to blood loss during childbirth and menstrual cycles. She no longer has menstrual cycles and is currently taking an iron supplement once a day, which she suspects may contribute to her stomach upset when taken alongside Lexapro.            07/31/2023    8:48 AM 07/03/2023    9:34 AM 10/04/2018   10:21 AM  Depression screen PHQ 2/9  Decreased Interest 3 1 0  Down, Depressed, Hopeless 3 1 0  PHQ - 2 Score 6 2 0  Altered sleeping 3 1   Tired, decreased energy 0 1   Change in appetite 0 1   Feeling bad or failure about yourself  0 1   Trouble concentrating 3 0   Moving slowly or fidgety/restless 0 0   Suicidal thoughts 0 0   PHQ-9 Score 12 6   Difficult doing work/chores Somewhat difficult Very difficult        07/31/2023    8:49 AM 07/03/2023    9:37 AM  GAD 7 : Generalized Anxiety Score  Nervous,  Anxious, on Edge 3 1  Control/stop worrying 3 1  Worry too much - different things 3 1  Trouble relaxing 3 1  Restless 0 0  Easily annoyed or irritable 0 1  Afraid - awful might happen 3 1  Total GAD 7 Score 15 6  Anxiety Difficulty Somewhat difficult Somewhat difficult    Social History   Tobacco Use   Smoking status: Never   Smokeless tobacco: Never  Vaping Use   Vaping status: Never Used  Substance Use Topics   Alcohol use: Yes    Comment: occasional   Drug use: Never    Review of Systems Per HPI unless specifically indicated above     Objective:    BP 122/68   Pulse 86   Ht 5\' 6"  (1.676 m)   Wt 158 lb (71.7 kg)   LMP 09/12/2017   SpO2 99%   BMI 25.50 kg/m   Wt Readings from Last 3 Encounters:  07/31/23 158 lb (71.7 kg)  07/03/23 157 lb (71.2 kg)  12/19/18 154 lb 12.8 oz (70.2 kg)    Physical Exam Vitals and nursing note reviewed.  Constitutional:      General: She is not in acute distress.    Appearance: Normal appearance. She is well-developed. She is not diaphoretic.     Comments: Well-appearing,  comfortable, cooperative  HENT:     Head: Normocephalic and atraumatic.  Eyes:     General:        Right eye: No discharge.        Left eye: No discharge.     Conjunctiva/sclera: Conjunctivae normal.  Cardiovascular:     Rate and Rhythm: Normal rate.  Pulmonary:     Effort: Pulmonary effort is normal.  Skin:    General: Skin is warm and dry.     Findings: No erythema or rash.  Neurological:     Mental Status: She is alert and oriented to person, place, and time.  Psychiatric:        Mood and Affect: Mood normal.        Behavior: Behavior normal.        Thought Content: Thought content normal.     Comments: Well groomed, good eye contact, normal speech and thoughts. Mood depressed slightly tearful     Results for orders placed or performed in visit on 10/04/18  Iron, TIBC and Ferritin Panel   Collection Time: 05/22/18 12:00 AM  Result Value Ref  Range   Iron 140   CBC and differential   Collection Time: 05/22/18 12:00 AM  Result Value Ref Range   Platelets 316 150 - 399   WBC 5.8   VITAMIN D 25 Hydroxy (Vit-D Deficiency, Fractures)   Collection Time: 05/22/18 12:00 AM  Result Value Ref Range   Vit D, 25-Hydroxy 14.9   Basic metabolic panel   Collection Time: 05/22/18 12:00 AM  Result Value Ref Range   Glucose 75    BUN 11 4 - 21   Creatinine 0.7 0.5 - 1.1   Potassium 4.7 3.4 - 5.3   Sodium 140 137 - 147  Hepatic function panel   Collection Time: 05/22/18 12:00 AM  Result Value Ref Range   Alkaline Phosphatase 60 25 - 125   ALT 17 7 - 35   AST 18 13 - 35   Bilirubin, Total 0.4   TSH   Collection Time: 05/22/18 12:00 AM  Result Value Ref Range   TSH 1.77 0.41 - 5.90  CBC and differential   Collection Time: 05/22/18 12:00 AM  Result Value Ref Range   Hemoglobin 12.0 12.0 - 16.0   HCT 37 36 - 46      Assessment & Plan:   Problem List Items Addressed This Visit     Major depressive disorder, recurrent, moderate (HCC) - Primary   Relevant Orders   COMPLETE METABOLIC PANEL WITH GFR   Other Visit Diagnoses       Anxiety         Iron deficiency anemia due to chronic blood loss       Relevant Orders   CBC with Differential/Platelet   Iron, TIBC and Ferritin Panel   COMPLETE METABOLIC PANEL WITH GFR        Depression major recurrent moderate  Anxiety Some improvement on Lexapro 5 mg but experiencing nausea. Concerns about addiction addressed. Symptoms of feeling overwhelmed persist. Medication may need more time or adjustment.  - Continue Lexapro 5 mg daily. Avoid idose increase - Provide contact information for therapists offering virtual visits. - Encourage consistent daily intake of medication. - Reassured about the non-addictive nature of Lexapro. - Monitor for improvement over the next six weeks. - Consider medication adjustment if nausea persists or no improvement is seen. - Encourage follow-up  with therapist. AVS info given for name and phone #  for 2 therapist options, they attempted to call but did not schedule  Iron Deficiency Anemia Low iron levels persist. Iron supplements may exacerbate nausea with Lexapro. Evaluation of iron levels planned. - Order iron panel and chemistry tests. - Pause oral iron supplementation until test results are available. It can be causing her upset stomach - Provide results via phone call within 24-48 hours.      Orders Placed This Encounter  Procedures   CBC with Differential/Platelet   Iron, TIBC and Ferritin Panel   COMPLETE METABOLIC PANEL WITH GFR    No orders of the defined types were placed in this encounter.   Follow up plan: Return in about 6 weeks (around 09/11/2023) for 6 weeks follow-up depression, anxiety.   Saralyn Pilar, DO [facility] 07/31/2023, 8:41 AM

## 2023-08-01 LAB — CBC WITH DIFFERENTIAL/PLATELET
Absolute Lymphocytes: 1344 {cells}/uL (ref 850–3900)
Absolute Monocytes: 273 {cells}/uL (ref 200–950)
Basophils Absolute: 28 {cells}/uL (ref 0–200)
Basophils Relative: 0.6 %
Eosinophils Absolute: 61 {cells}/uL (ref 15–500)
Eosinophils Relative: 1.3 %
HCT: 39.1 % (ref 35.0–45.0)
Hemoglobin: 11.9 g/dL (ref 11.7–15.5)
MCH: 24.1 pg — ABNORMAL LOW (ref 27.0–33.0)
MCHC: 30.4 g/dL — ABNORMAL LOW (ref 32.0–36.0)
MCV: 79.1 fL — ABNORMAL LOW (ref 80.0–100.0)
MPV: 9.7 fL (ref 7.5–12.5)
Monocytes Relative: 5.8 %
Neutro Abs: 2994 {cells}/uL (ref 1500–7800)
Neutrophils Relative %: 63.7 %
Platelets: 293 10*3/uL (ref 140–400)
RBC: 4.94 10*6/uL (ref 3.80–5.10)
RDW: 13.7 % (ref 11.0–15.0)
Total Lymphocyte: 28.6 %
WBC: 4.7 10*3/uL (ref 3.8–10.8)

## 2023-08-01 LAB — IRON,TIBC AND FERRITIN PANEL
%SAT: 26 % (ref 16–45)
Ferritin: 33 ng/mL (ref 16–232)
Iron: 72 ug/dL (ref 40–190)
TIBC: 272 ug/dL (ref 250–450)

## 2023-08-01 LAB — COMPLETE METABOLIC PANEL WITH GFR
AG Ratio: 1.4 (calc) (ref 1.0–2.5)
ALT: 16 U/L (ref 6–29)
AST: 16 U/L (ref 10–35)
Albumin: 4.1 g/dL (ref 3.6–5.1)
Alkaline phosphatase (APISO): 52 U/L (ref 31–125)
BUN: 8 mg/dL (ref 7–25)
CO2: 29 mmol/L (ref 20–32)
Calcium: 9.1 mg/dL (ref 8.6–10.2)
Chloride: 104 mmol/L (ref 98–110)
Creat: 0.78 mg/dL (ref 0.50–0.99)
Globulin: 3 g/dL (ref 1.9–3.7)
Glucose, Bld: 89 mg/dL (ref 65–99)
Potassium: 4.4 mmol/L (ref 3.5–5.3)
Sodium: 139 mmol/L (ref 135–146)
Total Bilirubin: 0.3 mg/dL (ref 0.2–1.2)
Total Protein: 7.1 g/dL (ref 6.1–8.1)
eGFR: 95 mL/min/{1.73_m2} (ref >=60)

## 2023-09-11 ENCOUNTER — Encounter: Payer: Self-pay | Admitting: Family Medicine

## 2023-09-11 ENCOUNTER — Other Ambulatory Visit: Payer: Self-pay | Admitting: Family Medicine

## 2023-09-11 ENCOUNTER — Ambulatory Visit: Admitting: Family Medicine

## 2023-09-11 VITALS — BP 110/70 | HR 67 | Resp 17 | Ht 66.0 in | Wt 160.4 lb

## 2023-09-11 DIAGNOSIS — Z131 Encounter for screening for diabetes mellitus: Secondary | ICD-10-CM

## 2023-09-11 DIAGNOSIS — D500 Iron deficiency anemia secondary to blood loss (chronic): Secondary | ICD-10-CM

## 2023-09-11 DIAGNOSIS — F3342 Major depressive disorder, recurrent, in full remission: Secondary | ICD-10-CM

## 2023-09-11 DIAGNOSIS — F419 Anxiety disorder, unspecified: Secondary | ICD-10-CM

## 2023-09-11 DIAGNOSIS — Z1211 Encounter for screening for malignant neoplasm of colon: Secondary | ICD-10-CM | POA: Diagnosis not present

## 2023-09-11 DIAGNOSIS — Z0000 Encounter for general adult medical examination without abnormal findings: Secondary | ICD-10-CM

## 2023-09-11 DIAGNOSIS — E7800 Pure hypercholesterolemia, unspecified: Secondary | ICD-10-CM

## 2023-09-11 NOTE — Progress Notes (Signed)
 Subjective:    Patient ID: Julie Oconnor, female    DOB: [birth year], 43 y.o.   MRN: [id number]  Julie Oconnor is a 43 y.o. female presenting on 09/11/2023 for Depression and Anxiety   HPI  Discussed the use of AI scribe software for clinical note transcription with the patient, who gave verbal consent to proceed.  History of Present Illness   Julie Oconnor is a 43 year old female who presents for follow-up regarding her Lexapro  medication.  She feels significantly better since her last visit in February when she started started on Lexapro  5 mg daily. She continues to take this medication daily and has not required a refill yet, indicating she still has a supply available.  She attempted to schedule an appointment with a therapist but was unable to do so due to scheduling issues. However, she feels improved and currently does not feel the need for therapy.  She underwent blood work previously, which included tests for iron deficiency and chemistry, and reports that the results were satisfactory with no major issues noted. She is due for routine blood work in October, which will include cholesterol and blood sugar tests.         09/11/2023    8:49 AM 07/31/2023    8:48 AM 07/03/2023    9:34 AM  Depression screen PHQ 2/9  Decreased Interest 0 3 1  Down, Depressed, Hopeless 0 3 1  PHQ - 2 Score 0 6 2  Altered sleeping 0 3 1  Tired, decreased energy 0 0 1  Change in appetite 0 0 1  Feeling bad or failure about yourself  0 0 1  Trouble concentrating 0 3 0  Moving slowly or fidgety/restless 0 0 0  Suicidal thoughts 0 0 0  PHQ-9 Score 0 12 6  Difficult doing work/chores Not difficult at all Somewhat difficult Very difficult       09/11/2023    8:49 AM 07/31/2023    8:49 AM 07/03/2023    9:37 AM  GAD 7 : Generalized Anxiety Score  Nervous, Anxious, on Edge 0 3 1  Control/stop worrying 0 3 1  Worry too much - different things 0 3 1  Trouble relaxing 0  3 1  Restless 0 0 0  Easily annoyed or irritable 0 0 1  Afraid - awful might happen 0 3 1  Total GAD 7 Score 0 15 6  Anxiety Difficulty Not difficult at all Somewhat difficult Somewhat difficult    Social History   Tobacco Use   Smoking status: Never   Smokeless tobacco: Never  Vaping Use   Vaping status: Never Used  Substance Use Topics   Alcohol use: Yes    Comment: occasional   Drug use: Never    Review of Systems Per HPI unless specifically indicated above     Objective:    BP 110/70 (BP Location: Left Arm, Patient Position: Sitting, Cuff Size: Normal)   Pulse 67   Resp 17   Ht 5\' 6"  (1.676 m)   Wt 160 lb 6.4 oz (72.8 kg)   LMP 09/12/2017   SpO2 97%   BMI 25.89 kg/m   Wt Readings from Last 3 Encounters:  09/11/23 160 lb 6.4 oz (72.8 kg)  07/31/23 158 lb (71.7 kg)  07/03/23 157 lb (71.2 kg)    Physical Exam Vitals and nursing note reviewed.  Constitutional:      General: She is not in acute distress.    Appearance: Normal  appearance. She is well-developed. She is not diaphoretic.     Comments: Well-appearing, comfortable, cooperative  HENT:     Head: Normocephalic and atraumatic.  Eyes:     General:        Right eye: No discharge.        Left eye: No discharge.     Conjunctiva/sclera: Conjunctivae normal.  Cardiovascular:     Rate and Rhythm: Normal rate.  Pulmonary:     Effort: Pulmonary effort is normal.  Skin:    General: Skin is warm and dry.     Findings: No erythema or rash.  Neurological:     Mental Status: She is alert and oriented to person, place, and time.  Psychiatric:        Mood and Affect: Mood normal.        Behavior: Behavior normal.        Thought Content: Thought content normal.     Comments: Well groomed, good eye contact, normal speech and thoughts     Results for orders placed or performed in visit on 07/31/23  CBC with Differential/Platelet   Collection Time: 07/31/23  9:17 AM  Result Value Ref Range   WBC 4.7 3.8 -  10.8 Thousand/uL   RBC 4.94 3.80 - 5.10 Million/uL   Hemoglobin 11.9 11.7 - 15.5 g/dL   HCT 24.4 01.0 - 27.2 %   MCV 79.1 (L) 80.0 - 100.0 fL   MCH 24.1 (L) 27.0 - 33.0 pg   MCHC 30.4 (L) 32.0 - 36.0 g/dL   RDW 53.6 64.4 - 03.4 %   Platelets 293 140 - 400 Thousand/uL   MPV 9.7 7.5 - 12.5 fL   Neutro Abs 2,994 1,500 - 7,800 cells/uL   Absolute Lymphocytes 1,344 850 - 3,900 cells/uL   Absolute Monocytes 273 200 - 950 cells/uL   Eosinophils Absolute 61 15 - 500 cells/uL   Basophils Absolute 28 0 - 200 cells/uL   Neutrophils Relative % 63.7 %   Total Lymphocyte 28.6 %   Monocytes Relative 5.8 %   Eosinophils Relative 1.3 %   Basophils Relative 0.6 %  Iron, TIBC and Ferritin Panel   Collection Time: 07/31/23  9:17 AM  Result Value Ref Range   Iron 72 40 - 190 mcg/dL   TIBC 742 595 - 638 mcg/dL (calc)   %SAT 26 16 - 45 % (calc)   Ferritin 33 16 - 232 ng/mL  COMPLETE METABOLIC PANEL WITH GFR   Collection Time: 07/31/23  9:17 AM  Result Value Ref Range   Glucose, Bld 89 65 - 99 mg/dL   BUN 8 7 - 25 mg/dL   Creat 7.56 4.33 - 2.95 mg/dL   eGFR 95 > OR = 60 JO/ACZ/6.60Y3   BUN/Creatinine Ratio SEE NOTE: 6 - 22 (calc)   Sodium 139 135 - 146 mmol/L   Potassium 4.4 3.5 - 5.3 mmol/L   Chloride 104 98 - 110 mmol/L   CO2 29 20 - 32 mmol/L   Calcium 9.1 8.6 - 10.2 mg/dL   Total Protein 7.1 6.1 - 8.1 g/dL   Albumin 4.1 3.6 - 5.1 g/dL   Globulin 3.0 1.9 - 3.7 g/dL (calc)   AG Ratio 1.4 1.0 - 2.5 (calc)   Total Bilirubin 0.3 0.2 - 1.2 mg/dL   Alkaline phosphatase (APISO) 52 31 - 125 U/L   AST 16 10 - 35 U/L   ALT 16 6 - 29 U/L      Assessment & Plan:   Problem  List Items Addressed This Visit     Major depressive disorder, recurrent, in full remission (HCC) - Primary   Other Visit Diagnoses       Screening for colon cancer       Relevant Orders   Cologuard        Major Depression recurrent in complete remission Significant improvement with Lexapro  5 mg daily for several  months. Now symptoms have resolved. No current need for therapy. Plans to discontinue Lexapro  after current supply. - Continue Lexapro  5 mg daily until current supply is finished. - Advise on tapering off Lexapro  by alternating days when ready to discontinue. - Note we attempted to refer to Psychology therapist but unsuccessful and she declines need at this time. - Contact office if therapy is needed in the future.  Future Wellness Visit Discussed need for basic blood work and colon cancer screening. Recommended Cologuard for screening. - Schedule basic blood work for October, including cholesterol and blood glucose. - Order Cologuard test for colon cancer screening.        Orders Placed This Encounter  Procedures   Cologuard    No orders of the defined types were placed in this encounter.   Follow up plan: Return for 6 month fasting lab > 1 week later Annual Physical.  Future labs ordered for 03/12/24  Domingo Friend, DO [facility] 09/11/2023, 9:03 AM

## 2023-09-11 NOTE — Patient Instructions (Addendum)
 Thank you for coming to the office today.  When ready to phase off of medication You can alternate days to discontinue the Escitalopram  5mg , take one then skip one then take one and skip one.  [email or web address]   DUE for FASTING BLOOD WORK (no food or drink after midnight before the lab appointment, only water or coffee without cream/sugar on the morning of)  SCHEDULE "Lab Only" visit in the morning at the clinic for lab draw in 6 MONTHS   - Make sure Lab Only appointment is at about 1 week before your next appointment, so that results will be available  For Lab Results, once available within 2-3 days of blood draw, you can can log in to MyChart online to view your results and a brief explanation. Also, we can discuss results at next follow-up visit.   Please schedule a Follow-up Appointment to: Return for 6 month fasting lab > 1 week later Annual Physical.  If you have any other questions or concerns, please feel free to call the office or send a message through MyChart. You may also schedule an earlier appointment if necessary.  Additionally, you may be receiving a survey about your experience at our office within a few days to 1 week by e-mail or mail. We value your feedback.  Domingo Friend, DO [facility], [place]

## 2023-09-22 LAB — COLOGUARD: COLOGUARD: NEGATIVE

## 2023-11-26 IMAGING — MG MM DIGITAL SCREENING BILAT W/ TOMO AND CAD
8 series · 9 of 24 positions shown · non-contrast
Comparison: Previous exam(s).

CLINICAL DATA: Screening.

EXAM:
DIGITAL SCREENING BILATERAL MAMMOGRAM WITH TOMOSYNTHESIS AND CAD
TECHNIQUE: Bilateral screening digital craniocaudal and mediolateral oblique
mammograms were obtained. Bilateral screening digital breast
tomosynthesis was performed. The images were evaluated with
computer-aided detection.

[L MLO synth-2D]
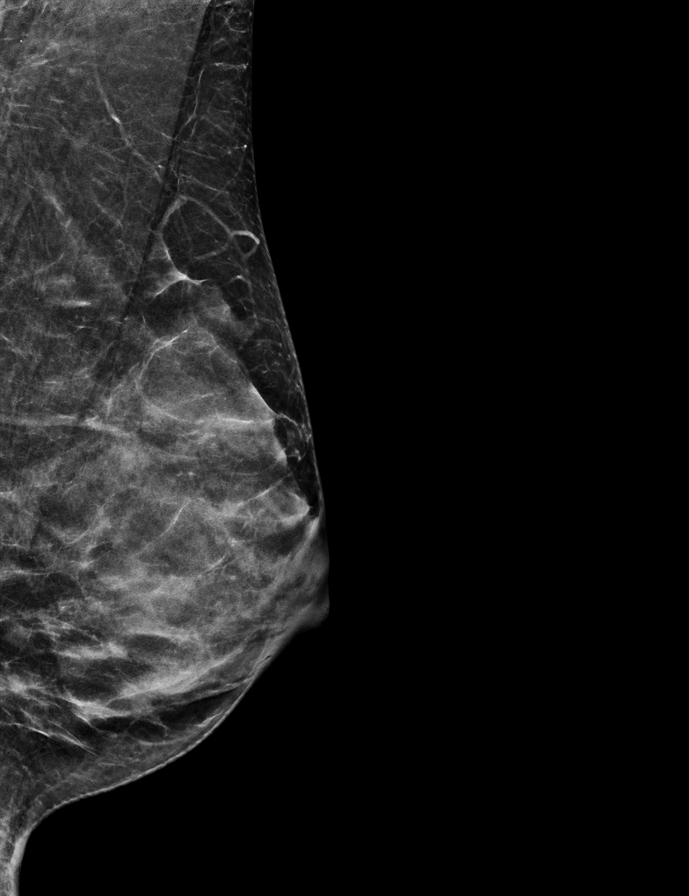

[R CC synth-2D]
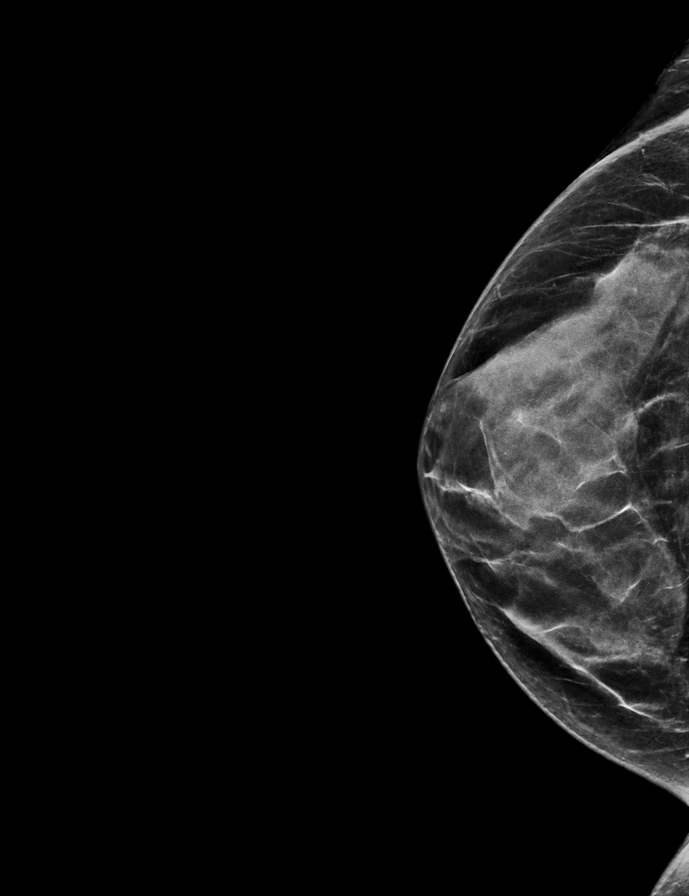

[L CC synth-2D]
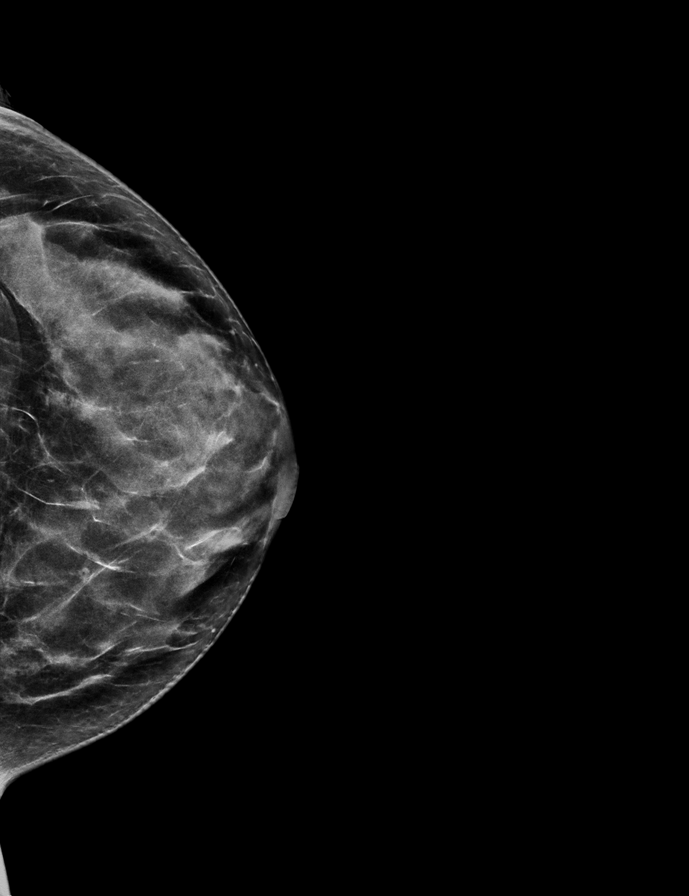

[R MLO synth-2D]
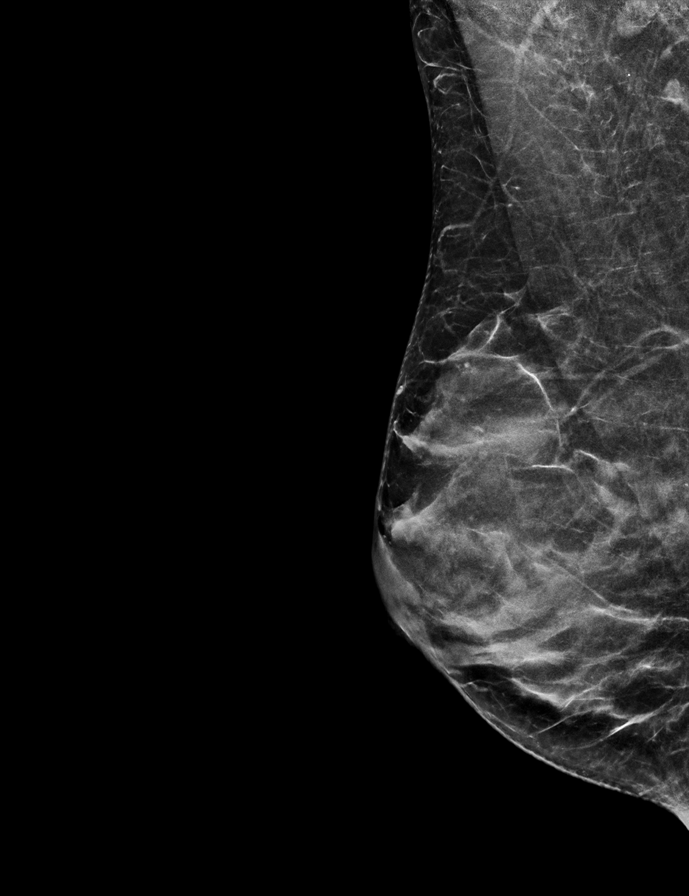

[L MLO tomo · 2 of 52 frames shown]
[frame 17/52]
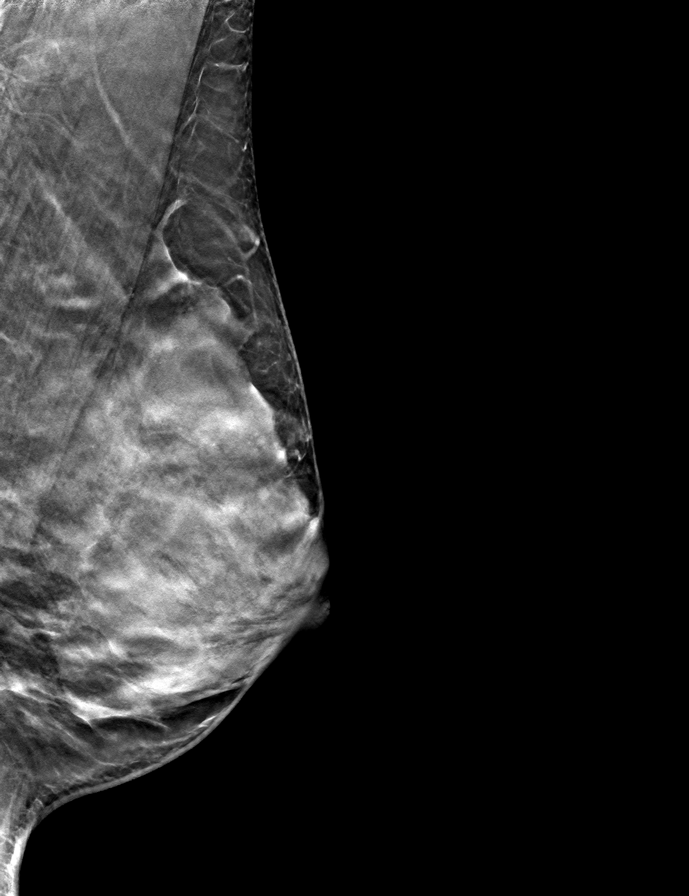
[frame 27/52]
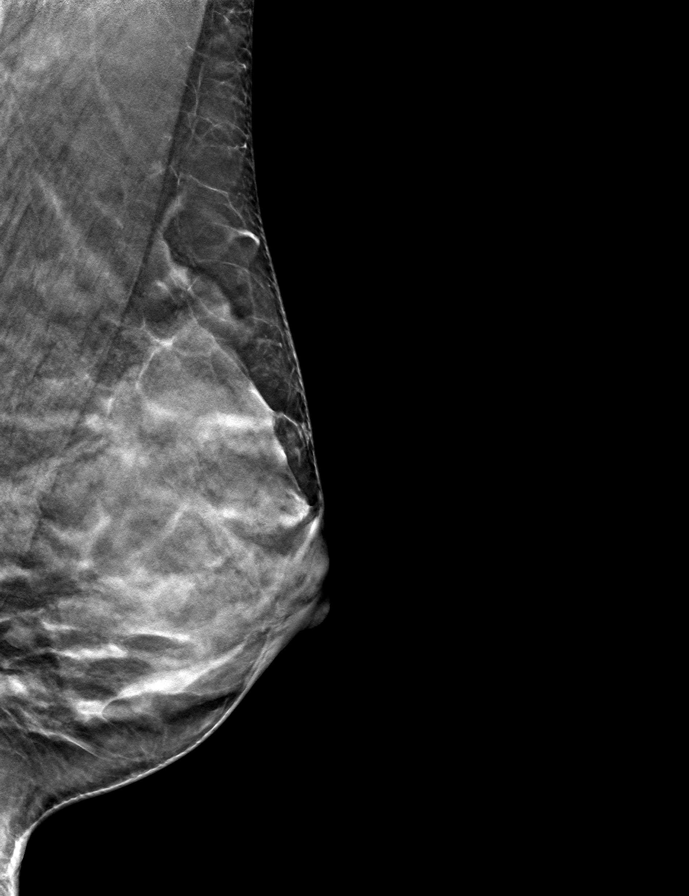

[L CC tomo · tomo slice 29/56.0]
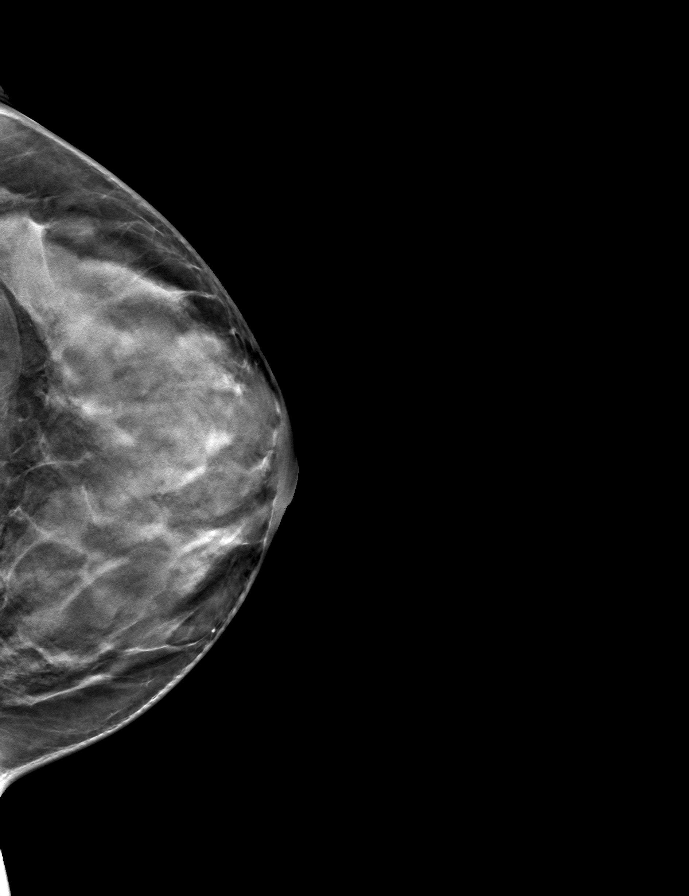

[R CC tomo · tomo slice 28/55.0]
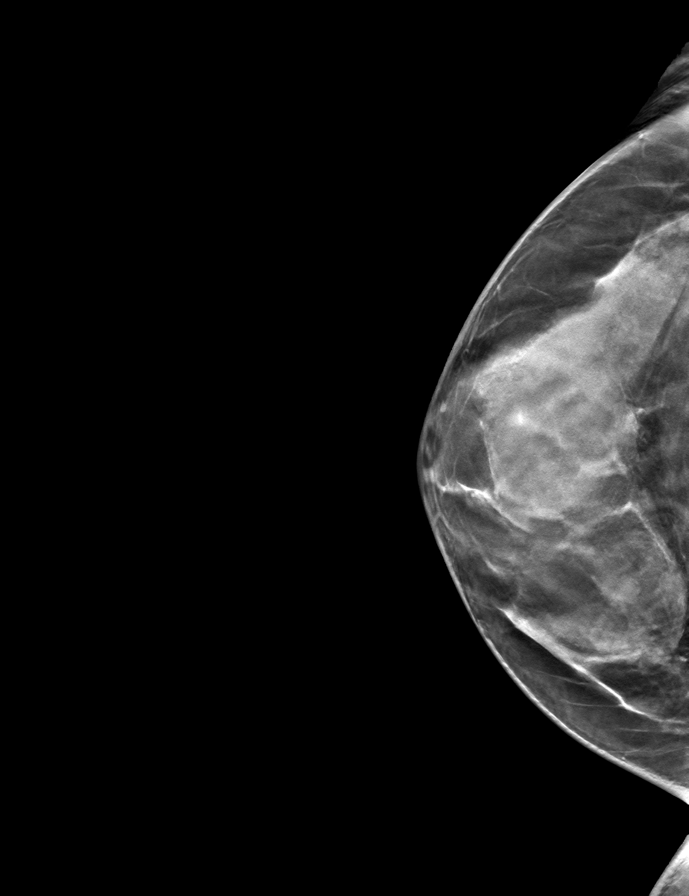

[R MLO tomo · tomo slice 27/52.0]
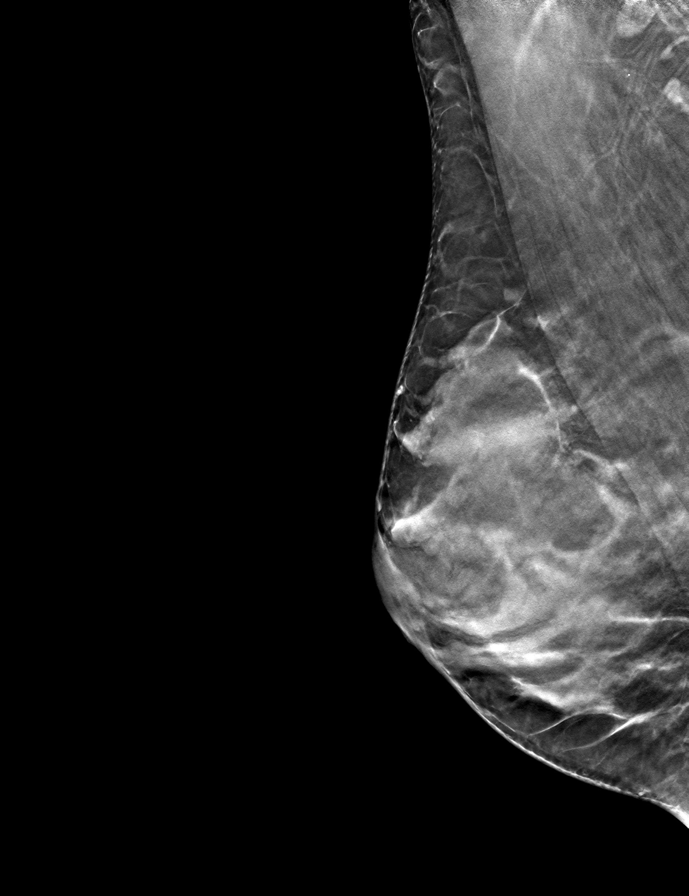

[9 of 24 positions shown; findings below may reference images not displayed]

ACR Breast Density Category d: The breast tissue is extremely dense,
which lowers the sensitivity of mammography
FINDINGS: There are no findings suspicious for malignancy.
IMPRESSION: No mammographic evidence of malignancy. A result letter of this
screening mammogram will be mailed directly to the patient.

RECOMMENDATION:
Screening mammogram in one year. (Code:[id number]-[id number])

BI-RADS CATEGORY  1: Negative.

## 2024-03-12 ENCOUNTER — Other Ambulatory Visit

## 2024-03-12 DIAGNOSIS — Z0000 Encounter for general adult medical examination without abnormal findings: Secondary | ICD-10-CM

## 2024-03-12 DIAGNOSIS — D500 Iron deficiency anemia secondary to blood loss (chronic): Secondary | ICD-10-CM

## 2024-03-12 DIAGNOSIS — Z131 Encounter for screening for diabetes mellitus: Secondary | ICD-10-CM

## 2024-03-12 DIAGNOSIS — E7800 Pure hypercholesterolemia, unspecified: Secondary | ICD-10-CM

## 2024-03-13 LAB — CBC WITH DIFFERENTIAL/PLATELET
Absolute Lymphocytes: 2026 {cells}/uL (ref 850–3900)
Absolute Monocytes: 551 {cells}/uL (ref 200–950)
Basophils Absolute: 48 {cells}/uL (ref 0–200)
Basophils Relative: 0.7 %
Eosinophils Absolute: 68 {cells}/uL (ref 15–500)
Eosinophils Relative: 1 %
HCT: 38.7 % (ref 35.0–45.0)
Hemoglobin: 11.8 g/dL (ref 11.7–15.5)
MCH: 24.1 pg — ABNORMAL LOW (ref 27.0–33.0)
MCHC: 30.5 g/dL — ABNORMAL LOW (ref 32.0–36.0)
MCV: 79.1 fL — ABNORMAL LOW (ref 80.0–100.0)
MPV: 10 fL (ref 7.5–12.5)
Monocytes Relative: 8.1 %
Neutro Abs: 4107 {cells}/uL (ref 1500–7800)
Neutrophils Relative %: 60.4 %
Platelets: 280 Thousand/uL (ref 140–400)
RBC: 4.89 Million/uL (ref 3.80–5.10)
RDW: 14.3 % (ref 11.0–15.0)
Total Lymphocyte: 29.8 %
WBC: 6.8 Thousand/uL (ref 3.8–10.8)

## 2024-03-13 LAB — COMPREHENSIVE METABOLIC PANEL WITH GFR
AG Ratio: 1.5 (calc) (ref 1.0–2.5)
ALT: 15 U/L (ref 6–29)
AST: 19 U/L (ref 10–35)
Albumin: 4.3 g/dL (ref 3.6–5.1)
Alkaline phosphatase (APISO): 46 U/L (ref 31–125)
BUN: 14 mg/dL (ref 7–25)
CO2: 26 mmol/L (ref 20–32)
Calcium: 9.2 mg/dL (ref 8.6–10.2)
Chloride: 104 mmol/L (ref 98–110)
Creat: 0.81 mg/dL (ref 0.50–0.99)
Globulin: 2.8 g/dL (ref 1.9–3.7)
Glucose, Bld: 78 mg/dL (ref 65–99)
Potassium: 4.3 mmol/L (ref 3.5–5.3)
Sodium: 138 mmol/L (ref 135–146)
Total Bilirubin: 0.4 mg/dL (ref 0.2–1.2)
Total Protein: 7.1 g/dL (ref 6.1–8.1)
eGFR: 91 mL/min/1.73m2 (ref >=60)

## 2024-03-13 LAB — LIPID PANEL
Cholesterol: 231 mg/dL — ABNORMAL HIGH (ref <200)
HDL: 73 mg/dL (ref >=50)
LDL Cholesterol (Calc): 143 mg/dL — ABNORMAL HIGH
Non-HDL Cholesterol (Calc): 158 mg/dL — ABNORMAL HIGH (ref <130)
Total CHOL/HDL Ratio: 3.2 (calc) (ref <5.0)
Triglycerides: 60 mg/dL (ref <150)

## 2024-03-13 LAB — HEMOGLOBIN A1C
Hgb A1c MFr Bld: 5.3 % (ref <5.7)
Mean Plasma Glucose: 105 mg/dL
eAG (mmol/L): 5.8 mmol/L

## 2024-03-13 LAB — TSH: TSH: 4.08 m[IU]/L

## 2024-03-14 ENCOUNTER — Ambulatory Visit: Payer: Self-pay | Admitting: Family Medicine

## 2024-03-19 ENCOUNTER — Encounter: Payer: Self-pay | Admitting: Family Medicine

## 2024-03-19 ENCOUNTER — Ambulatory Visit (INDEPENDENT_AMBULATORY_CARE_PROVIDER_SITE_OTHER): Admitting: Family Medicine

## 2024-03-19 VITALS — BP 108/68 | HR 67 | Ht 66.0 in | Wt 159.5 lb

## 2024-03-19 DIAGNOSIS — F3342 Major depressive disorder, recurrent, in full remission: Secondary | ICD-10-CM | POA: Diagnosis not present

## 2024-03-19 DIAGNOSIS — E7800 Pure hypercholesterolemia, unspecified: Secondary | ICD-10-CM

## 2024-03-19 DIAGNOSIS — D500 Iron deficiency anemia secondary to blood loss (chronic): Secondary | ICD-10-CM | POA: Diagnosis not present

## 2024-03-19 DIAGNOSIS — F419 Anxiety disorder, unspecified: Secondary | ICD-10-CM

## 2024-03-19 DIAGNOSIS — Z0000 Encounter for general adult medical examination without abnormal findings: Secondary | ICD-10-CM | POA: Diagnosis not present

## 2024-03-19 NOTE — Patient Instructions (Addendum)
 Thank you for coming to the office today.  Mild elevated Cholesterol LDL 143, goal to limit excess cholesterol in diet.  Try to avoid high fat foods with beef, pork. Avoid whole milk or heavy cream and avoid fried foods.  May need to try smaller portion for dinner to avoid the bloating and gas.  Try OTC Gas-X medication only as needed for gas and bloating in evening.  DUE for FASTING BLOOD WORK (no food or drink after midnight before the lab appointment, only water or coffee without cream/sugar on the morning of)  SCHEDULE Lab Only visit in the morning at the clinic for lab draw in 1 YEAR  - Make sure Lab Only appointment is at about 1 week before your next appointment, so that results will be available  For Lab Results, once available within 2-3 days of blood draw, you can can log in to MyChart online to view your results and a brief explanation. Also, we can discuss results at next follow-up visit.    Please schedule a Follow-up Appointment to: Return for 1 year fasting lab > 1 week later Annual Physical.  If you have any other questions or concerns, please feel free to call the office or send a message through MyChart. You may also schedule an earlier appointment if necessary.  Additionally, you may be receiving a survey about your experience at our office within a few days to 1 week by e-mail or mail. We value your feedback.  Marsa Officer, DO [facility], [place]

## 2024-03-19 NOTE — Progress Notes (Unsigned)
 Subjective:    Patient ID: Julie Oconnor, female    DOB: March 06, 1981, 43 y.o.   MRN: [id number]  Julie Oconnor is a 43 y.o. female presenting on 03/19/2024 for Annual Exam   HPI  Discussed the use of AI scribe software for clinical note transcription with the patient, who gave verbal consent to proceed.  History of Present Illness   HYPERLIPIDEMIA: - Reports no concerns. Last lipid panel ***, ***controlled ***abnormal, fasting today for lipid panel check - Currently taking ***, tolerating well without side effects or myalgias Lifestyle - Diet: Not following diet - Exercise: She does some free weights and cardio exercise  History of Anemia Last lab showed Hgb stable 11.8. Past 6+ years ago had microcytic anemia MCV mild low 79 She feels well and takes oral Iron supplement daily still     She feels significantly better since her last visit in February when she started started on Lexapro  5 mg daily. She continues to take this medication daily and has not required a refill yet, indicating she still has a supply available.   She attempted to schedule an appointment with a therapist but was unable to do so due to scheduling issues. However, she feels improved and currently does not feel the need for therapy.   She underwent blood work previously, which included tests for iron deficiency and chemistry, and reports that the results were satisfactory with no major issues noted. She is due for routine blood work in October, which will include cholesterol and blood sugar tests.   Health Maintenance: ***     03/19/2024    8:06 AM 09/11/2023    8:49 AM 07/31/2023    8:48 AM  Depression screen PHQ 2/9  Decreased Interest 0 0 3  Down, Depressed, Hopeless 0 0 3  PHQ - 2 Score 0 0 6  Altered sleeping  0 3  Tired, decreased energy  0 0  Change in appetite  0 0  Feeling bad or failure about yourself   0 0  Trouble concentrating  0 3  Moving slowly or fidgety/restless   0 0  Suicidal thoughts  0 0  PHQ-9 Score  0 12  Difficult doing work/chores  Not difficult at all Somewhat difficult       03/19/2024    8:06 AM 09/11/2023    8:49 AM 07/31/2023    8:49 AM 07/03/2023    9:37 AM  GAD 7 : Generalized Anxiety Score  Nervous, Anxious, on Edge 0 0 3 1  Control/stop worrying 0 0 3 1  Worry too much - different things 0 0 3 1  Trouble relaxing 0 0 3 1  Restless 0 0 0 0  Easily annoyed or irritable 0 0 0 1  Afraid - awful might happen 0 0 3 1  Total GAD 7 Score 0 0 15 6  Anxiety Difficulty  Not difficult at all Somewhat difficult Somewhat difficult     Past Medical History:  Diagnosis Date   Medical history non-contributory    Past Surgical History:  Procedure Laterality Date   BILATERAL SALPINGECTOMY Bilateral 09/25/2017   Procedure: BILATERAL SALPINGECTOMY;  Surgeon: Schermerhorn, Debby PARAS, MD;  Location: [facility];  Service: Gynecology;  Laterality: Bilateral;   NO PAST SURGERIES     VAGINAL HYSTERECTOMY N/A 09/25/2017   Procedure: HYSTERECTOMY VAGINAL;  Surgeon: Schermerhorn, Debby PARAS, MD;  Location: [facility];  Service: Gynecology;  Laterality: N/A;   Social History   Socioeconomic History  Marital status: Married    Spouse name: Not on file   Number of children: Not on file   Years of education: Not on file   Highest education level: Not on file  Occupational History   Not on file  Tobacco Use   Smoking status: Never   Smokeless tobacco: Never  Vaping Use   Vaping status: Never Used  Substance and Sexual Activity   Alcohol use: Yes    Comment: occasional   Drug use: Never   Sexual activity: Not on file  Other Topics Concern   Not on file  Social History Narrative   Not on file   Social Drivers of Health   Financial Resource Strain: Not on file  Food Insecurity: No Food Insecurity (02/21/2023)   Received from [facility]   Hunger Vital Sign    Within the past 12 months, you worried that your food would run  out before you got the money to buy more.: Never true    Within the past 12 months, the food you bought just didn't last and you didn't have money to get more.: Never true  Transportation Needs: No Transportation Needs (02/21/2023)   Received from [facility] - Transportation    In the past 12 months, has lack of transportation kept you from medical appointments or from getting medications?: No    Lack of Transportation (Non-Medical): No  Physical Activity: Not on file  Stress: Not on file  Social Connections: Not on file  Intimate Partner Violence: Not on file   Family History  Problem Relation Age of Onset   Heart disease Mother    Breast cancer Neg Hx    Current Outpatient Medications on File Prior to Visit  Medication Sig   IRON-FOLIC ACID PO Take 1 tablet by mouth daily.    No current facility-administered medications on file prior to visit.    Review of Systems Per HPI unless specifically indicated above     Objective:    BP 108/68 (BP Location: Left Arm, Patient Position: Sitting, Cuff Size: Normal)   Pulse 67   Ht 5' 6 (1.676 m)   Wt 159 lb 8 oz (72.3 kg)   LMP 09/12/2017   SpO2 98%   BMI 25.74 kg/m   Wt Readings from Last 3 Encounters:  03/19/24 159 lb 8 oz (72.3 kg)  09/11/23 160 lb 6.4 oz (72.8 kg)  07/31/23 158 lb (71.7 kg)    Physical Exam  Results for orders placed or performed in visit on 03/12/24  Comprehensive metabolic panel with GFR   Collection Time: 03/12/24  8:11 AM  Result Value Ref Range   Glucose, Bld 78 65 - 99 mg/dL   BUN 14 7 - 25 mg/dL   Creat 9.18 9.49 - 9.00 mg/dL   eGFR 91 > OR = 60 fO/fpw/8.26f7   BUN/Creatinine Ratio SEE NOTE: 6 - 22 (calc)   Sodium 138 135 - 146 mmol/L   Potassium 4.3 3.5 - 5.3 mmol/L   Chloride 104 98 - 110 mmol/L   CO2 26 20 - 32 mmol/L   Calcium 9.2 8.6 - 10.2 mg/dL   Total Protein 7.1 6.1 - 8.1 g/dL   Albumin 4.3 3.6 - 5.1 g/dL   Globulin 2.8 1.9 - 3.7 g/dL (calc)   AG  Ratio 1.5 1.0 - 2.5 (calc)   Total Bilirubin 0.4 0.2 - 1.2 mg/dL   Alkaline phosphatase (APISO) 46 31 - 125 U/L  AST 19 10 - 35 U/L   ALT 15 6 - 29 U/L  CBC with Differential/Platelet   Collection Time: 03/12/24  8:11 AM  Result Value Ref Range   WBC 6.8 3.8 - 10.8 Thousand/uL   RBC 4.89 3.80 - 5.10 Million/uL   Hemoglobin 11.8 11.7 - 15.5 g/dL   HCT 61.2 64.9 - 54.9 %   MCV 79.1 (L) 80.0 - 100.0 fL   MCH 24.1 (L) 27.0 - 33.0 pg   MCHC 30.5 (L) 32.0 - 36.0 g/dL   RDW 85.6 88.9 - 84.9 %   Platelets 280 140 - 400 Thousand/uL   MPV 10.0 7.5 - 12.5 fL   Neutro Abs 4,107 1,500 - 7,800 cells/uL   Absolute Lymphocytes 2,026 850 - 3,900 cells/uL   Absolute Monocytes 551 200 - 950 cells/uL   Eosinophils Absolute 68 15 - 500 cells/uL   Basophils Absolute 48 0 - 200 cells/uL   Neutrophils Relative % 60.4 %   Total Lymphocyte 29.8 %   Monocytes Relative 8.1 %   Eosinophils Relative 1.0 %   Basophils Relative 0.7 %  Hemoglobin A1c   Collection Time: 03/12/24  8:11 AM  Result Value Ref Range   Hgb A1c MFr Bld 5.3 <5.7 %   Mean Plasma Glucose 105 mg/dL   eAG (mmol/L) 5.8 mmol/L  Lipid panel   Collection Time: 03/12/24  8:11 AM  Result Value Ref Range   Cholesterol 231 (H) <200 mg/dL   HDL 73 > OR = 50 mg/dL   Triglycerides 60 <849 mg/dL   LDL Cholesterol (Calc) 143 (H) mg/dL (calc)   Total CHOL/HDL Ratio 3.2 <5.0 (calc)   Non-HDL Cholesterol (Calc) 158 (H) <130 mg/dL (calc)  TSH   Collection Time: 03/12/24  8:11 AM  Result Value Ref Range   TSH 4.08 mIU/L      Assessment & Plan:   Problem List Items Addressed This Visit   None    Updated Health Maintenance information ***- Reviewed recent lab results with patient Encouraged improvement to lifestyle with diet and exercise -*** Goal of weight loss  Assessment and Plan Assessment & Plan      No orders of the defined types were placed in this encounter.   No orders of the defined types were placed in this  encounter.    Follow up plan: No follow-ups on file.  Marsa Officer, DO [facility] 03/19/2024, 8:38 AM

## 2024-03-20 ENCOUNTER — Other Ambulatory Visit: Payer: Self-pay | Admitting: Family Medicine

## 2024-03-20 DIAGNOSIS — Z131 Encounter for screening for diabetes mellitus: Secondary | ICD-10-CM

## 2024-03-20 DIAGNOSIS — Z0000 Encounter for general adult medical examination without abnormal findings: Secondary | ICD-10-CM

## 2024-03-20 DIAGNOSIS — D500 Iron deficiency anemia secondary to blood loss (chronic): Secondary | ICD-10-CM

## 2024-03-20 DIAGNOSIS — E7800 Pure hypercholesterolemia, unspecified: Secondary | ICD-10-CM

## 2025-03-18 ENCOUNTER — Other Ambulatory Visit

## 2025-03-25 ENCOUNTER — Encounter: Admitting: Family Medicine
